# Patient Record
Sex: Female | Born: 1950 | Race: White | Hispanic: Yes | Marital: Married | Smoking: Never smoker
Health system: Southern US, Community
[De-identification: ages and names within clinical notes are randomized; demographics above are authoritative.]

## PROBLEM LIST (undated history)

## (undated) DIAGNOSIS — I10 Essential (primary) hypertension: Secondary | ICD-10-CM

## (undated) DIAGNOSIS — E119 Type 2 diabetes mellitus without complications: Secondary | ICD-10-CM

## (undated) HISTORY — PX: CATARACT EXTRACTION, BILATERAL: SHX1313

## (undated) HISTORY — DX: Essential (primary) hypertension: I10

## (undated) HISTORY — DX: Type 2 diabetes mellitus without complications: E11.9

---

## 2016-09-28 ENCOUNTER — Encounter: Payer: Self-pay | Admitting: Internal Medicine

## 2016-09-28 ENCOUNTER — Ambulatory Visit: Payer: Self-pay | Attending: Internal Medicine | Admitting: Internal Medicine

## 2016-09-28 VITALS — BP 135/80 | HR 70 | Temp 98.7°F | Resp 16 | Ht 61.0 in | Wt 129.0 lb

## 2016-09-28 DIAGNOSIS — I152 Hypertension secondary to endocrine disorders: Secondary | ICD-10-CM | POA: Insufficient documentation

## 2016-09-28 DIAGNOSIS — Z1159 Encounter for screening for other viral diseases: Secondary | ICD-10-CM | POA: Insufficient documentation

## 2016-09-28 DIAGNOSIS — N8110 Cystocele, unspecified: Secondary | ICD-10-CM | POA: Insufficient documentation

## 2016-09-28 DIAGNOSIS — I10 Essential (primary) hypertension: Secondary | ICD-10-CM | POA: Insufficient documentation

## 2016-09-28 DIAGNOSIS — E785 Hyperlipidemia, unspecified: Secondary | ICD-10-CM | POA: Insufficient documentation

## 2016-09-28 DIAGNOSIS — K029 Dental caries, unspecified: Secondary | ICD-10-CM | POA: Insufficient documentation

## 2016-09-28 DIAGNOSIS — E119 Type 2 diabetes mellitus without complications: Secondary | ICD-10-CM | POA: Insufficient documentation

## 2016-09-28 LAB — POCT GLYCOSYLATED HEMOGLOBIN (HGB A1C): Hemoglobin A1C: 5.7

## 2016-09-28 LAB — GLUCOSE, POCT (MANUAL RESULT ENTRY): POC GLUCOSE: 150 mg/dL — AB (ref 70–99)

## 2016-09-28 MED ORDER — LISINOPRIL-HYDROCHLOROTHIAZIDE 20-12.5 MG PO TABS: 1.0000 | ORAL_TABLET | Freq: Every day | ORAL | 6 refills | Status: DC

## 2016-09-28 MED ORDER — GLIPIZIDE-METFORMIN HCL 5-500 MG PO TABS: 1.0000 | ORAL_TABLET | Freq: Two times a day (BID) | ORAL | 6 refills | Status: DC

## 2016-09-28 MED ORDER — GLIPIZIDE 5 MG PO TABS: 5.0000 mg | ORAL_TABLET | Freq: Every day | ORAL | 6 refills | Status: DC

## 2016-09-28 MED ORDER — SIMVASTATIN 20 MG PO TABS: 20.0000 mg | ORAL_TABLET | Freq: Every day | ORAL | 6 refills | Status: DC

## 2016-09-28 MED ORDER — GLUCOSE BLOOD VI STRP: ORAL_STRIP | 6 refills | Status: DC

## 2016-09-28 NOTE — Patient Instructions (Addendum)
Please have patient sign a release for us to get her records from [place]. Give appointment with Ms. Boyd to sign up for [employer].  Gave appointment for her to return to the lab this week to have her blood test done.   I have sent your medications to our pharmacy for pick up.   Pelvic Organ Prolapse Pelvic organ prolapse is the stretching, bulging, or dropping of pelvic organs into an abnormal position. It happens when the muscles and tissues that surround and support pelvic structures are stretched or weak. Pelvic organ prolapse can involve:  Vagina (vaginal prolapse).  Uterus (uterine prolapse).  Bladder (cystocele).  Rectum (rectocele).  Intestines (enterocele). When organs other than the vagina are involved, they often bulge into the vagina or protrude from the vagina, depending on how severe the prolapse is. What are the causes? Causes of this condition include:  Pregnancy, labor, and childbirth.  Long-lasting (chronic) cough.  Chronic constipation.  Obesity.  Past pelvic surgery.  Aging. During and after menopause, a decreased production of the hormone estrogen can weaken pelvic ligaments and muscles.  Consistently lifting more than 50 lb (23 kg).  Buildup of fluid in the abdomen due to certain diseases and other conditions. What are the signs or symptoms? Symptoms of this condition include:  Loss of bladder control when you cough, sneeze, strain, and exercise (stress incontinence). This may be worse immediately following childbirth, and it may gradually improve over time.  Feeling pressure in your pelvis or vagina. This pressure may increase when you cough or when you are having a bowel movement.  A bulge that protrudes from the opening of your vagina or against your vaginal wall. If your uterus protrudes through the opening of your vagina and rubs against your clothing, you may also experience soreness, ulcers, infection, pain, and bleeding.  Increased  effort to have a bowel movement or urinate.  Pain in your low back.  Pain, discomfort, or disinterest in sexual intercourse.  Repeated bladder infections (urinary tract infections).  Difficulty inserting or inability to insert a tampon or applicator. In some people, this condition does not cause any symptoms. How is this diagnosed? Your health care provider may perform an internal and external vaginal and rectal exam. During the exam, you may be asked to cough and strain while you are lying down, sitting, and standing up. Your health care provider will determine if other tests are required, such as bladder function tests. How is this treated? In most cases, this condition needs to be treated only if it produces symptoms. No treatment is guaranteed to correct the prolapse or relieve the symptoms completely. Treatment may include:  Lifestyle changes, such as:  Avoiding drinking beverages that contain caffeine.  Increasing your intake of high-fiber foods. This can help to decrease constipation and straining during bowel movements.  Emptying your bladder at scheduled times (bladder training therapy). This can help to reduce or avoid urinary incontinence.  Losing weight if you are overweight or obese.  Estrogen. Estrogen may help mild prolapse by increasing the strength and tone of pelvic floor muscles.  Kegel exercises. These may help mild cases of prolapse by strengthening and tightening the muscles of the pelvic floor.  Pessary insertion. A pessary is a soft, flexible device that is placed into your vagina by your health care provider to help support the vaginal walls and keep pelvic organs in place.  Surgery. This is often the only form of treatment for severe prolapse. Different types of surgeries  are available. Follow these instructions at home:  Wear a sanitary pad or absorbent product if you have urinary incontinence.  Avoid heavy lifting and straining with exercise and work.  Do not hold your breath when you perform mild to moderate lifting and exercise activities. Limit your activities as directed by your health care provider.  Take medicines only as directed by your health care provider.  Perform Kegel exercises as directed by your health care provider.  If you have a pessary, take care of it as directed by your health care provider. Contact a health care provider if:  Your symptoms interfere with your daily activities or sex life.  You need medicine to help with the discomfort.  You notice bleeding from the vagina that is not related to your period.  You have a fever.  You have pain or bleeding when you urinate.  You have bleeding when you have a bowel movement.  You lose urine when you have sex.  You have chronic constipation.  You have a pessary that falls out.  You have vaginal discharge that has a bad smell.  You have low abdominal pain or cramping that is unusual for you. This information is not intended to replace advice given to you by your health care provider. Make sure you discuss any questions you have with your health care provider. Document Released: 11/22/2013 Document Revised: 10/03/2015 Document Reviewed: 07/10/2013 Elsevier Interactive Patient Education  2017 [employer].

## 2016-09-28 NOTE — Progress Notes (Signed)
Patient ID: Michele [name], female    DOB: 07/12/1950  MRN: [id number]  CC: No chief complaint on file.   Subjective: Michele Gourdydia Sweeny is a 66 y.o. female who presents for new pt.  Recently moved from [place] with [place].  Hx of DM type 2, HTN and hyperlipidemia.  Her concerns today include:  She is LPN but currently unemployed. Needs help with getting meds RF 1. DM: -checking BS 2-3 x a day. Range 130-160. Sometimes 200. -Eating habits: "I try to keep balance." Walking daily for 30 mins Eye: last exam 2 yrs ago -some cramps in legs at night. No numbness in feet  2.  HTN -compliant with med Limits salt -no CP/SOB/LE edema.  3.  Lipid: tolerating Zocor.  Out of med x 2 wks.    4.  Vaginal prolapse for 5 yrs.  + leakage of urine. Unable to afford to get fix in [place].  Request GYN appt   HM: last MMG and PAP less than 1 yr ago in [place].  Never had colonoscopy, had FOBT 2 yrs ago.  Had Tdap.  Not on File  Social History   Social History  . Marital status: Married    Spouse name: N/A  . Number of children: 2  . Years of education: N/A   Occupational History  . LPN    Social History Main Topics  . Smoking status: Never Smoker  . Smokeless tobacco: Never Used  . Alcohol use No  . Drug use: No  . Sexual activity: Yes   Other Topics Concern  . Not on file   Social History Narrative  . No narrative on file    Family History  Problem Relation Age of Onset  . Diabetes Mother   . Hypertension Mother   . Diabetes Father   . Hypertension Father   . Diabetes Brother   . Hypertension Brother     History reviewed. No pertinent surgical history.  ROS: Review of Systems  Constitutional: Negative for activity change, appetite change, fatigue, fever and unexpected weight change.  Eyes: Negative for visual disturbance.  Respiratory: Negative for cough and chest tightness.   Cardiovascular: Negative for chest pain, palpitations and leg swelling.    Gastrointestinal: Negative for abdominal pain and constipation.  Musculoskeletal: Negative for arthralgias.  Psychiatric/Behavioral: Negative for sleep disturbance.    PHYSICAL EXAM: BP 135/80 (BP Location: Left Arm, Patient Position: Sitting, Cuff Size: Normal)   Pulse 70   Temp 98.7 F (37.1 C) (Oral)   Resp 16   Ht 5\' 1"  (1.549 m)   Wt 129 lb (58.5 kg)   SpO2 97%   BMI 24.37 kg/m   Physical Exam  Constitutional: She appears well-developed and well-nourished.  HENT:  Nose: Nose normal.  Mouth/Throat: Oropharynx is clear and moist. No oropharyngeal exudate.  Cavity RT upper canine  Eyes: EOM are normal. No scleral icterus.  Neck: No thyromegaly present.  Cardiovascular: Normal rate and regular rhythm.  Exam reveals no gallop.   No murmur heard. Pulmonary/Chest: Effort normal and breath sounds normal.  Genitourinary:  Genitourinary Comments: CMA, Vonna KotykJay present: No external lesions. Cervix sitting at introtus and prolapses out with valsalva. Manually pushed back into vagina  Musculoskeletal: Normal range of motion.  Lymphadenopathy:    She has no cervical adenopathy.  Neurological: No cranial nerve deficit.  Skin: No rash noted.  Psychiatric: She has a normal mood and affect.   Results for orders placed or performed in visit on 09/28/16  Glucose (  CBG)  Result Value Ref Range   POC Glucose 150 (A) 70 - 99 mg/dl  HgB N5A  Result Value Ref Range   Hemoglobin A1C 5.7      Depression screen [facility] 2/9 09/28/2016  Decreased Interest 0  Down, Depressed, Hopeless 0  PHQ - 2 Score 0  Altered sleeping 0  Tired, decreased energy 0  Change in appetite 0  Feeling bad or failure about yourself  0  Trouble concentrating 0  Moving slowly or fidgety/restless 0  Suicidal thoughts 0  PHQ-9 Score 0    GAD 7 : Generalized Anxiety Score 09/28/2016  Nervous, Anxious, on Edge 0  Control/stop worrying 0  Worry too much - different things 0  Trouble relaxing 0  Restless 0  Easily  annoyed or irritable 0  Afraid - awful might happen 0  Total GAD 7 Score 0    ASSESSMENT AND PLAN: 1. Controlled type 2 diabetes mellitus without complication, without long-term current use of insulin (HCC) -encouraged to continue healthy eating habits and regular exercise -stop Glucotrol 5 mg if starts getting hypoglycemic episodes - glipiZIDE (GLUCOTROL) 5 MG tablet; Take 1 tablet (5 mg total) by mouth daily before breakfast.  Dispense: 30 tablet; Refill: 6 - glipiZIDE-metformin (METAGLIP) 5-500 MG tablet; Take 1 tablet by mouth 2 (two) times daily before a meal.  Dispense: 60 tablet; Refill: 6 - glucose blood test strip; Use as instructed  Dispense: 100 each; Refill: 6 - CBC; Future - Comprehensive metabolic panel; Future - Lipid panel; Future - Glucose (CBG) - HgB A1c  2. Essential hypertension DASH encouraged - lisinopril-hydrochlorothiazide (PRINZIDE,ZESTORETIC) 20-12.5 MG tablet; Take 1 tablet by mouth daily.  Dispense: 30 tablet; Refill: 6  3. Hyperlipidemia, unspecified hyperlipidemia type - simvastatin (ZOCOR) 20 MG tablet; Take 1 tablet (20 mg total) by mouth daily.  Dispense: 30 tablet; Refill: 6  4. Vaginal prolapse - Ambulatory referral to Gynecology  5. Dental caries - Ambulatory referral to Dentistry  6. Need for hepatitis C screening test - Hepatitis C Antibody; Future  HM: last MMG and PAP less than 1 yr ago in [place].  Never had colonoscopy, had FOBT 2 yrs ago.  Had Tdap. Declines HIV screen.  Pt to sign release for me to get her records from previous provider in [place]  Patient was given the opportunity to ask questions.  Patient verbalized understanding of the plan and was able to repeat key elements of the plan.   Orders Placed This Encounter  Procedures  . CBC  . Comprehensive metabolic panel  . Lipid panel  . Hepatitis C Antibody  . Ambulatory referral to Gynecology  . Ambulatory referral to Dentistry  . Glucose (CBG)  . HgB A1c      Requested Prescriptions   Signed Prescriptions Disp Refills  . glipiZIDE (GLUCOTROL) 5 MG tablet 30 tablet 6    Sig: Take 1 tablet (5 mg total) by mouth daily before breakfast.  . glipiZIDE-metformin (METAGLIP) 5-500 MG tablet 60 tablet 6    Sig: Take 1 tablet by mouth 2 (two) times daily before a meal.  . glucose blood test strip 100 each 6    Sig: Use as instructed  . lisinopril-hydrochlorothiazide (PRINZIDE,ZESTORETIC) 20-12.5 MG tablet 30 tablet 6    Sig: Take 1 tablet by mouth daily.  . simvastatin (ZOCOR) 20 MG tablet 30 tablet 6    Sig: Take 1 tablet (20 mg total) by mouth daily.    No Follow-up on file.  Jonah Blue, MD, FACP

## 2016-09-29 ENCOUNTER — Ambulatory Visit: Payer: Self-pay | Attending: Internal Medicine

## 2016-09-29 DIAGNOSIS — E119 Type 2 diabetes mellitus without complications: Secondary | ICD-10-CM | POA: Insufficient documentation

## 2016-09-29 DIAGNOSIS — Z1159 Encounter for screening for other viral diseases: Secondary | ICD-10-CM | POA: Diagnosis not present

## 2016-09-29 MED FILL — SIMVASTATIN 20 MG TABLET: 20 | 30 days supply | Qty: 30 | Fill #0

## 2016-09-29 MED FILL — glipiZIDE 5 MG TABS: 5 | 30 days supply | Qty: 30 | Fill #0

## 2016-09-29 MED FILL — TRUE METRIX TEST STRIP: 30 days supply | Qty: 100 | Fill #0

## 2016-09-29 MED FILL — LISINOPRIL-HCTZ 20-12.5 MG: 20-12.5 | 30 days supply | Qty: 30 | Fill #0

## 2016-09-29 NOTE — Progress Notes (Signed)
Patient here for lab visit only 

## 2016-09-30 ENCOUNTER — Ambulatory Visit: Payer: Self-pay | Attending: Internal Medicine

## 2016-09-30 ENCOUNTER — Other Ambulatory Visit: Payer: Self-pay | Admitting: *Deleted

## 2016-09-30 ENCOUNTER — Encounter: Payer: Self-pay | Admitting: Obstetrics & Gynecology

## 2016-09-30 LAB — COMPREHENSIVE METABOLIC PANEL
ALK PHOS: 69 IU/L (ref 39–117)
ALT: 15 IU/L (ref 0–32)
AST: 19 IU/L (ref 0–40)
Albumin/Globulin Ratio: 1.6 (ref 1.2–2.2)
Albumin: 4.3 g/dL (ref 3.6–4.8)
BILIRUBIN TOTAL: 0.3 mg/dL (ref 0.0–1.2)
BUN/Creatinine Ratio: 13 (ref 12–28)
BUN: 7 mg/dL — ABNORMAL LOW (ref 8–27)
CHLORIDE: 98 mmol/L (ref 96–106)
CO2: 26 mmol/L (ref 18–29)
CREATININE: 0.55 mg/dL — AB (ref 0.57–1.00)
Calcium: 10 mg/dL (ref 8.7–10.3)
GFR calc Af Amer: 115 mL/min/{1.73_m2} (ref >59)
GFR calc non Af Amer: 99 mL/min/{1.73_m2} (ref >59)
GLOBULIN, TOTAL: 2.7 g/dL (ref 1.5–4.5)
GLUCOSE: 110 mg/dL — AB (ref 65–99)
Potassium: 4 mmol/L (ref 3.5–5.2)
SODIUM: 140 mmol/L (ref 134–144)
Total Protein: 7 g/dL (ref 6.0–8.5)

## 2016-09-30 LAB — LIPID PANEL
Chol/HDL Ratio: 3.3 ratio (ref 0.0–4.4)
Cholesterol, Total: 230 mg/dL — ABNORMAL HIGH (ref 100–199)
HDL: 69 mg/dL (ref >39)
LDL Calculated: 122 mg/dL — ABNORMAL HIGH (ref 0–99)
Triglycerides: 194 mg/dL — ABNORMAL HIGH (ref 0–149)
VLDL Cholesterol Cal: 39 mg/dL (ref 5–40)

## 2016-09-30 LAB — CBC
HEMATOCRIT: 39.5 % (ref 34.0–46.6)
Hemoglobin: 13.3 g/dL (ref 11.1–15.9)
MCH: 27.9 pg (ref 26.6–33.0)
MCHC: 33.7 g/dL (ref 31.5–35.7)
MCV: 83 fL (ref 79–97)
Platelets: 333 10*3/uL (ref 150–379)
RBC: 4.77 x10E6/uL (ref 3.77–5.28)
RDW: 15 % (ref 12.3–15.4)
WBC: 6.6 10*3/uL (ref 3.4–10.8)

## 2016-09-30 LAB — HEPATITIS C ANTIBODY: Hep C Virus Ab: <0.1 s/co ratio (ref 0.0–0.9)

## 2016-09-30 MED ORDER — TRUEPLUS LANCETS 28G MISC: 1.0000 | Freq: Three times a day (TID) | 12 refills | Status: DC

## 2016-09-30 MED ORDER — TRUE METRIX METER W/DEVICE KIT: 1.0000 | PACK | Freq: Three times a day (TID) | 0 refills | Status: DC

## 2016-09-30 MED ORDER — METFORMIN HCL 500 MG PO TABS: 500.0000 mg | ORAL_TABLET | Freq: Two times a day (BID) | ORAL | 3 refills | Status: DC

## 2016-10-01 ENCOUNTER — Other Ambulatory Visit: Payer: Self-pay | Admitting: Internal Medicine

## 2016-10-01 ENCOUNTER — Encounter: Payer: Self-pay | Admitting: Internal Medicine

## 2016-10-01 DIAGNOSIS — E119 Type 2 diabetes mellitus without complications: Secondary | ICD-10-CM

## 2016-10-01 MED ORDER — GLIPIZIDE 5 MG PO TABS: ORAL_TABLET | ORAL | 6 refills | Status: DC

## 2016-10-01 MED ORDER — METFORMIN HCL 500 MG PO TABS: 500.0000 mg | ORAL_TABLET | Freq: Two times a day (BID) | ORAL | 3 refills | Status: DC

## 2016-10-01 MED FILL — ?GLIPIZIDE 5MG TABLET: 5 | 30 days supply | Qty: 90 | Fill #0

## 2016-10-01 MED FILL — TRUEplus LANCETS 28G MISC: 30 days supply | Qty: 100 | Fill #0

## 2016-10-01 MED FILL — ?METFORMIN HCL 500MG TABLET: 500 | 30 days supply | Qty: 60 | Fill #0

## 2016-10-01 MED FILL — !TRUE METRIX BLOOD GLUCOSE: 30 days supply | Qty: 1 | Fill #0

## 2016-10-01 NOTE — Progress Notes (Signed)
I received message from pharmacy request Metfor-Glipizide combo be changed to individual meds.  I called pt to confirm that she was taking Metfor-Glip 500/5 mg BID and Glipizide 5 mg once a day.  I told her that [facility] pharmacy does not have the Metformin/Glipizide combo so will change to Metformin 500 mg BID and Glipizide 5 mg in a.m and 10 mg p.m with meals.  Pt expressed understanding.  Rx sent to pharmacy.

## 2016-10-01 NOTE — Progress Notes (Signed)
[facility] Metformin-Glipizide combo be changed to individual meds.  I called pt to confirm that she was on Metformin-Glipizide 500/5 mg BID and an additional 5 mg Glipizide in mornings.  She confirmed this.   So will send rxn to pharmacy for Glipizide 5 mg in a.m and 10 mg in p.m with meals and Metformin 500 mg BID.  I advised pt of same.

## 2016-10-02 ENCOUNTER — Telehealth: Payer: Self-pay

## 2016-10-02 NOTE — Telephone Encounter (Signed)
-----   Message from Marcine Matareborah B Johnson, MD sent at 09/30/2016  3:08 PM EDT ----- Let pt know that blood count, kidney and liver function tests are normal.  LDL cholesterol was elevated at 122 with goal being less than 70.  This may be due to the fact that she was out of her cholesterol medications for several weeks prior to recent visit.  Continue to take cholesterol lowering medication.  Hepatitis C screen is negative.

## 2016-10-02 NOTE — Telephone Encounter (Signed)
Clld pt -   [employer] Lafonda MossesDiana ID# [id number]  Advsd of lab results; provider's notation - Pt stated she understood and had no questions.

## 2016-10-27 ENCOUNTER — Ambulatory Visit (INDEPENDENT_AMBULATORY_CARE_PROVIDER_SITE_OTHER): Payer: Self-pay | Admitting: Obstetrics & Gynecology

## 2016-10-27 ENCOUNTER — Ambulatory Visit: Payer: Self-pay | Attending: Internal Medicine

## 2016-10-27 ENCOUNTER — Encounter: Payer: Self-pay | Admitting: Obstetrics & Gynecology

## 2016-10-27 VITALS — BP 146/78 | HR 84 | Wt 127.9 lb

## 2016-10-27 DIAGNOSIS — N813 Complete uterovaginal prolapse: Secondary | ICD-10-CM

## 2016-10-27 MED FILL — SIMVASTATIN 20 MG TABLET: 20 | 30 days supply | Qty: 30 | Fill #1

## 2016-10-27 MED FILL — LISINOPRIL-HCTZ 20-12.5 MG: 20-12.5 | 30 days supply | Qty: 30 | Fill #1

## 2016-10-27 MED FILL — ?METFORMIN HCL 500MG TABLET: 500 | 30 days supply | Qty: 60 | Fill #1

## 2016-10-27 NOTE — Progress Notes (Signed)
   Subjective:    Patient ID: Michele [name], female    DOB: 03/10/1951, 66 y.o.   MRN: [id number]  HPI 66 yo MH P4 here with a 6+ year h/o uterine prolapse. She has not had sex for about 2 years due to this issue. She says that her husband is ok with this. She categorically denies urinary incontinence "unless I've held it too long". She says that in the last month she has not had any urinary accidents.  She has used a Gelhorn pessary in the past.   Review of Systems She has DM. She reports a normal pap about a year ago.    Objective:   Physical Exam [id number] Breathing, conversing, and ambulating normally Her small uterus is at the vaginal introitus.  Moderate amount of vulvovaginal atrophy No masses with bimanual exam  I tried several pessaries. The #3 with support worked but she said that is was a little uncomfortable.     Assessment & Plan:  Uterine prolapse- I offered her a TVH/BSO/a&p repair, but also told her that it is likely that she may then have vaginal prolapse in the futures. I offered her a referral to a specialist, Dr. Lavella Hammockatherine Matthews. I offered to order a #2 pessary. She opts for the pessary She will return when it has arrived.

## 2016-11-16 MED FILL — ?METFORMIN HCL 500MG TABLET: 500 | 30 days supply | Qty: 60 | Fill #2

## 2016-11-16 MED FILL — LISINOPRIL-HCTZ 20-12.5 MG: 20-12.5 | 30 days supply | Qty: 30 | Fill #2

## 2016-11-19 MED FILL — TRUE METRIX TEST STRIP: 30 days supply | Qty: 100 | Fill #1

## 2016-11-25 ENCOUNTER — Encounter: Payer: Self-pay | Admitting: Obstetrics & Gynecology

## 2016-11-25 ENCOUNTER — Ambulatory Visit (INDEPENDENT_AMBULATORY_CARE_PROVIDER_SITE_OTHER): Payer: Self-pay | Admitting: Obstetrics & Gynecology

## 2016-11-25 VITALS — BP 145/77 | HR 79 | Wt 125.7 lb

## 2016-11-25 DIAGNOSIS — N813 Complete uterovaginal prolapse: Secondary | ICD-10-CM

## 2016-11-25 NOTE — Progress Notes (Signed)
   Subjective:    Patient ID: Michele [name], female    DOB: 10/03/1950, 66 y.o.   MRN: [id number]  HPI  66 yo lady here for her #2pessary with support. This was ordered several weeks ago.   Review of Systems     Objective:   Physical Exam [id number] Breathing, conversing, and ambulating normally I placed the pessary. It relieved her symptoms and did not cause pain. She was able to remove and replace it.       Assessment & Plan:  Uterine prolapse- pessary Rec remove it at least 1 night per week RTC 4 weeks/prn sooner

## 2016-12-07 ENCOUNTER — Ambulatory Visit: Payer: Self-pay | Attending: Internal Medicine | Admitting: Internal Medicine

## 2016-12-07 ENCOUNTER — Encounter: Payer: Self-pay | Admitting: Internal Medicine

## 2016-12-07 VITALS — BP 140/84 | HR 81 | Temp 98.1°F | Resp 16 | Wt 127.8 lb

## 2016-12-07 DIAGNOSIS — N814 Uterovaginal prolapse, unspecified: Secondary | ICD-10-CM | POA: Insufficient documentation

## 2016-12-07 DIAGNOSIS — E119 Type 2 diabetes mellitus without complications: Secondary | ICD-10-CM

## 2016-12-07 DIAGNOSIS — N8110 Cystocele, unspecified: Secondary | ICD-10-CM

## 2016-12-07 DIAGNOSIS — Z833 Family history of diabetes mellitus: Secondary | ICD-10-CM | POA: Insufficient documentation

## 2016-12-07 DIAGNOSIS — I10 Essential (primary) hypertension: Secondary | ICD-10-CM

## 2016-12-07 DIAGNOSIS — Z1211 Encounter for screening for malignant neoplasm of colon: Secondary | ICD-10-CM

## 2016-12-07 DIAGNOSIS — E1165 Type 2 diabetes mellitus with hyperglycemia: Secondary | ICD-10-CM

## 2016-12-07 DIAGNOSIS — IMO0001 Reserved for inherently not codable concepts without codable children: Secondary | ICD-10-CM

## 2016-12-07 DIAGNOSIS — Z79899 Other long term (current) drug therapy: Secondary | ICD-10-CM | POA: Insufficient documentation

## 2016-12-07 DIAGNOSIS — E785 Hyperlipidemia, unspecified: Secondary | ICD-10-CM | POA: Insufficient documentation

## 2016-12-07 DIAGNOSIS — Z8249 Family history of ischemic heart disease and other diseases of the circulatory system: Secondary | ICD-10-CM | POA: Insufficient documentation

## 2016-12-07 LAB — POCT URINALYSIS DIPSTICK
BILIRUBIN UA: NEGATIVE
KETONES UA: NEGATIVE
Nitrite, UA: NEGATIVE
Protein, UA: NEGATIVE
Urobilinogen, UA: 0.2 E.U./dL
pH, UA: 5 (ref 5.0–8.0)

## 2016-12-07 LAB — GLUCOSE, POCT (MANUAL RESULT ENTRY): POC Glucose: 359 mg/dl — AB (ref 70–99)

## 2016-12-07 MED ORDER — METFORMIN HCL 500 MG PO TABS: 500.0000 mg | ORAL_TABLET | Freq: Two times a day (BID) | ORAL | 6 refills | Status: DC

## 2016-12-07 MED ORDER — GLIPIZIDE 5 MG PO TABS: 5.0000 mg | ORAL_TABLET | Freq: Two times a day (BID) | ORAL | 6 refills | Status: DC

## 2016-12-07 MED FILL — ?METFORMIN HCL 500MG TABLET: 500 | 30 days supply | Qty: 60 | Fill #3

## 2016-12-07 MED FILL — SIMVASTATIN 20 MG TABLET: 20 | 30 days supply | Qty: 30 | Fill #2

## 2016-12-07 MED FILL — glipiZIDE 5 MG TABS: 5 | 30 days supply | Qty: 90 | Fill #1

## 2016-12-07 NOTE — Progress Notes (Signed)
Patient ID: Michele [name], female    DOB: 29-Jul-1950  MRN: [id number]  CC: Follow-up   Subjective: Michele [name] is a 66 y.o. female who presents for 2 mth f/u. Her concerns today include:  Patient with history of diabetes type 2, HTN, hyperlipidemia, vaginal prolapse  1. DM:  Checks BS 2-3 x a day but does not have log with her. Before breakfast -105-120, afternoon 150-160 and bedtime 200-250.  Suppose to be on metformin 500 mg twice a day and Glucotrol 5 mg a.m./10 mg p.m. however on questioning she is taking metformin only in the evenings and glipizide 5 mg twice a day. Does not take a.m. dose of metformin because she is afraid of blood sugars dropping too low. But again on further questioning she then said she is not taking it twice a day because the pharmacy is not giving her 30 day supply and she runs out before 1 month. -I called the pharmacy to verify. They have been giving her one-month supply on metformin. She last picked up glipizide in May -Patient then admitted that she was on vacation in [place] and misplaced her bottle of metformin.  -no increase thrist or frequent urination -due for eye exam -tries to avoid too much carb and sugar.  Walking daily.   2. HTN Compliant with lisinopril/HCTZ and DASH.Marland Kitchen No CP/SOB  3. Vaginal Prolapse -given pessary by GYN. Working OK  4. Due for colon CA screening. Does have [place] card.  -We requested her records from [place]. To date I have not received them.  Patient Active Problem List   Diagnosis Date Noted  . Complete uterine prolapse 10/27/2016  . Controlled type 2 diabetes mellitus without complication, without long-term current use of insulin ([place]) 09/28/2016  . Essential hypertension 09/28/2016  . Hyperlipidemia 09/28/2016  . Vaginal prolapse 09/28/2016     Current Outpatient Prescriptions on File Prior to Visit  Medication Sig Dispense Refill  . Blood Glucose Monitoring Suppl (TRUE METRIX METER) w/Device KIT 1  each by Does not apply route 3 (three) times daily. 1 kit 0  . glucose blood test strip Use as instructed 100 each 6  . lisinopril-hydrochlorothiazide (PRINZIDE,ZESTORETIC) 20-12.5 MG tablet Take 1 tablet by mouth daily. 30 tablet 6  . simvastatin (ZOCOR) 20 MG tablet Take 1 tablet (20 mg total) by mouth daily. 30 tablet 6  . TRUEPLUS LANCETS 28G MISC 1 each by Does not apply route 3 (three) times daily. 100 each 12   No current facility-administered medications on file prior to visit.     No Known Allergies  Social History   Social History  . Marital status: Married    Spouse name: N/A  . Number of children: 2  . Years of education: N/A   Occupational History  . LPN    Social History Main Topics  . Smoking status: Never Smoker  . Smokeless tobacco: Never Used  . Alcohol use No  . Drug use: No  . Sexual activity: Yes   Other Topics Concern  . Not on file   Social History Narrative  . No narrative on file    Family History  Problem Relation Age of Onset  . Diabetes Mother   . Hypertension Mother   . Diabetes Father   . Hypertension Father   . Diabetes Brother   . Hypertension Brother     No past surgical history on file.  ROS: Review of Systems Negative except as stated above. PHYSICAL EXAM: BP (!) 143/84  Pulse 81   Temp 98.1 F (36.7 C) (Oral)   Resp 16   Wt 127 lb 12.8 oz (58 kg)   SpO2 95%   BMI 24.15 kg/m   BP 140/84 Wt Readings from Last 3 Encounters:  12/07/16 127 lb 12.8 oz (58 kg)  11/25/16 125 lb 11.2 oz (57 kg)  10/27/16 127 lb 14.4 oz (58 kg)   Physical Exam  General appearance - alert, well appearing, elderly FM and in no distress Mental status - alert, oriented to person, place, and time Neck - supple, no significant adenopathy Chest - clear to auscultation, no wheezes, rales or rhonchi, symmetric air entry Heart - normal rate, regular rhythm, normal S1, S2, no murmurs, rubs, clicks or gallops Extremities - peripheral pulses  normal, no pedal edema, no clubbing or cyanosis Diabetic Foot Exam - Simple   Simple Foot Form Visual Inspection No deformities, no ulcerations, no other skin breakdown bilaterally:  Yes Sensation Testing Intact to touch and monofilament testing bilaterally:  Yes Pulse Check Comments      Results for orders placed or performed in visit on 12/07/16  POCT glucose (manual entry)  Result Value Ref Range   POC Glucose 359 (A) 70 - 99 mg/dl  POCT urinalysis dipstick  Result Value Ref Range   Color, UA clear    Clarity, UA yellow    Glucose, UA >=1,000    Bilirubin, UA negative    Ketones, UA negative    Spec Grav, UA <=1.005 (A) 1.010 - 1.025   Blood, UA trace    pH, UA 5.0 5.0 - 8.0   Protein, UA negative    Urobilinogen, UA 0.2 0.2 or 1.0 E.U./dL   Nitrite, UA negative    Leukocytes, UA Trace (A) Negative     ASSESSMENT AND PLAN: 1. Uncontrolled type 2 diabetes mellitus without complication, without long-term current use of insulin (HCC) -Encourage med compliance. Glipizide changed to 5 mg twice a day -Check blood sugars 2-3 times a day. She will bring in blood sugar readings on follow-up visit in 3 weeks with clinical pharmacists -Advised to have [facility] exam at [facility] or [facility] - POCT glucose (manual entry) - POCT urinalysis dipstick - metFORMIN (GLUCOPHAGE) 500 MG tablet; Take 1 tablet (500 mg total) by mouth 2 (two) times daily with a meal.  Dispense: 60 tablet; Refill: 6 - glipiZIDE (GLUCOTROL) 5 MG tablet; Take 1 tablet (5 mg total) by mouth 2 (two) times daily before a meal. 1 tab PO Q a.m with breakfast and 2 tabs with dinner  Dispense: 60 tablet; Refill: 6  2. Essential hypertension At goal Cont current med  3. Colon cancer screening - Ambulatory referral to Gastroenterology  4. Vaginal prolapse -doing well with trail of pessary  Await old records to confirm that she had mammogram and Pap less than 1 year ago per her report  Patient was given the opportunity  to ask questions.  Patient verbalized understanding of the plan and was able to repeat key elements of the plan.   Orders Placed This Encounter  Procedures  . Ambulatory referral to Gastroenterology  . POCT glucose (manual entry)  . POCT urinalysis dipstick     Requested Prescriptions   Signed Prescriptions Disp Refills  . metFORMIN (GLUCOPHAGE) 500 MG tablet 60 tablet 6    Sig: Take 1 tablet (500 mg total) by mouth 2 (two) times daily with a meal.  . glipiZIDE (GLUCOTROL) 5 MG tablet 60 tablet 6    Sig: Take  1 tablet (5 mg total) by mouth 2 (two) times daily before a meal. 1 tab PO Q a.m with breakfast and 2 tabs with dinner    Return in about 2 months (around 02/07/2017).  Karle Plumber, MD, FACP

## 2016-12-07 NOTE — Patient Instructions (Signed)
Give appt with Michele [name] in 3 weeks.   Take Metformin and glipizide twice a day as discussed.  Bring your blood sugar readings with you in 3 weeks when you come to see the clinical pharmacist.  [facility]   You have been referred for colonoscopy.

## 2016-12-21 MED FILL — glipiZIDE 5 MG TABS: 5 | 30 days supply | Qty: 90 | Fill #2

## 2016-12-21 MED FILL — LISINOPRIL-HCTZ 20-12.5 MG: 20-12.5 | 30 days supply | Qty: 30 | Fill #3

## 2016-12-21 MED FILL — TRUE METRIX TEST STRIP: 30 days supply | Qty: 100 | Fill #2

## 2016-12-24 ENCOUNTER — Ambulatory Visit (HOSPITAL_COMMUNITY)
Admission: EM | Admit: 2016-12-24 | Discharge: 2016-12-24 | Disposition: A | Discharge status: Home / Self Care | Payer: Self-pay | Attending: Family Medicine | Admitting: Family Medicine

## 2016-12-24 ENCOUNTER — Encounter (HOSPITAL_COMMUNITY): Payer: Self-pay | Admitting: Family Medicine

## 2016-12-24 DIAGNOSIS — R824 Acetonuria: Secondary | ICD-10-CM

## 2016-12-24 DIAGNOSIS — R81 Glycosuria: Secondary | ICD-10-CM

## 2016-12-24 DIAGNOSIS — N3090 Cystitis, unspecified without hematuria: Secondary | ICD-10-CM

## 2016-12-24 DIAGNOSIS — N814 Uterovaginal prolapse, unspecified: Secondary | ICD-10-CM

## 2016-12-24 DIAGNOSIS — R300 Dysuria: Secondary | ICD-10-CM

## 2016-12-24 LAB — POCT URINALYSIS DIP (DEVICE)
Glucose, UA: 100 mg/dL — AB
NITRITE: NEGATIVE
PH: 5.5 (ref 5.0–8.0)
UROBILINOGEN UA: 1 mg/dL (ref 0.0–1.0)

## 2016-12-24 MED ORDER — CEPHALEXIN 500 MG PO CAPS: 500.0000 mg | ORAL_CAPSULE | Freq: Four times a day (QID) | ORAL | 0 refills | Status: DC

## 2016-12-24 MED FILL — CEPHALEXIN 500 MG CAPSULE: 500 | 7 days supply | Qty: 28 | Fill #0

## 2016-12-24 NOTE — ED Triage Notes (Signed)
Pt here for mild RLQ pain and dysuria. sts also that she has a pessary and took it out today because she saw bleeding.

## 2016-12-24 NOTE — ED Provider Notes (Signed)
[facility]    CSN: [id number] Arrival date & time: 12/24/16  1430     History   Chief Complaint Chief Complaint  Patient presents with  . Dysuria    HPI Michele [name] is a 66 y.o. female.   66 year old female presents to the urgent care states she is had some vaginal bleeding, bleeding with urination, dysuria and right lower quadrant pain this started today after 11:30. She has a history of her "vagina dropping" however I suspect she means that her bladder was dropped. She had a pessary but recently removed it.      Past Medical History:  Diagnosis Date  . Diabetes mellitus without complication ([place])   . Hypertension     Patient Active Problem List   Diagnosis Date Noted  . Complete uterine prolapse 10/27/2016  . Controlled type 2 diabetes mellitus without complication, without long-term current use of insulin ([place]) 09/28/2016  . Essential hypertension 09/28/2016  . Hyperlipidemia 09/28/2016  . Vaginal prolapse 09/28/2016    History reviewed. No pertinent surgical history.  OB History    Gravida Para Term Preterm AB Living   '4 4 4         '$ SAB TAB Ectopic Multiple Live Births           4       Home Medications    Prior to Admission medications   Medication Sig Start Date End Date Taking? Authorizing Provider  Blood Glucose Monitoring Suppl (TRUE METRIX METER) w/Device KIT 1 each by Does not apply route 3 (three) times daily. 09/30/16   Ladell Pier, MD  cephALEXin (KEFLEX) 500 MG capsule Take 1 capsule (500 mg total) by mouth 4 (four) times daily. 12/24/16   Janne Napoleon, NP  glipiZIDE (GLUCOTROL) 5 MG tablet Take 1 tablet (5 mg total) by mouth 2 (two) times daily before a meal. 1 tab PO Q a.m with breakfast and 2 tabs with dinner 12/07/16   Ladell Pier, MD  glucose blood test strip Use as instructed 09/28/16   Ladell Pier, MD  lisinopril-hydrochlorothiazide (PRINZIDE,ZESTORETIC) 20-12.5 MG tablet Take 1 tablet by mouth daily.  09/28/16   Ladell Pier, MD  metFORMIN (GLUCOPHAGE) 500 MG tablet Take 1 tablet (500 mg total) by mouth 2 (two) times daily with a meal. 12/07/16   Ladell Pier, MD  simvastatin (ZOCOR) 20 MG tablet Take 1 tablet (20 mg total) by mouth daily. 09/28/16   Ladell Pier, MD  TRUEPLUS LANCETS 28G MISC 1 each by Does not apply route 3 (three) times daily. 09/30/16   Ladell Pier, MD    Family History Family History  Problem Relation Age of Onset  . Diabetes Mother   . Hypertension Mother   . Diabetes Father   . Hypertension Father   . Diabetes Brother   . Hypertension Brother     Social History Social History  Substance Use Topics  . Smoking status: Never Smoker  . Smokeless tobacco: Never Used  . Alcohol use No     Allergies   Patient has no known allergies.   Review of Systems Review of Systems  Constitutional: Negative.   Genitourinary: Positive for dysuria. Negative for menstrual problem and vaginal discharge.       As per history of present illness  All other systems reviewed and are negative.    Physical Exam Triage Vital Signs ED Triage Vitals [12/24/16 1459]  Enc Vitals Group     BP (!) 149/87  Pulse Rate 88     Resp 18     Temp 98.5 F (36.9 C)     Temp src      SpO2 97 %     Weight      Height      Head Circumference      Peak Flow      Pain Score 2     Pain Loc      Pain Edu?      Excl. in [place]?    No data found.   Updated Vital Signs BP (!) 149/87   Pulse 88   Temp 98.5 F (36.9 C)   Resp 18   SpO2 97%   Visual Acuity Right Eye Distance:   Left Eye Distance:   Bilateral Distance:    Right Eye Near:   Left Eye Near:    Bilateral Near:     Physical Exam  Constitutional: She appears well-developed and well-nourished. No distress.  Eyes: EOM are normal.  Neck: Neck supple.  Cardiovascular: Normal rate.   Pulmonary/Chest: Effort normal.  Genitourinary: Vagina normal. No vaginal discharge found.  Genitourinary  Comments: Cervix  in proper position however when the speculum is removed the cervix is brought out with the speculum to the introitus. The cervix is pain, no evidence of bleeding no discharge no other abnormalities. The meatus with minor erythema. Remainder of the anatomy is intact.  Loura Pardon, RN present during the exam  Musculoskeletal: She exhibits no edema.  Neurological: She is alert.  Skin: Skin is warm and dry.  Nursing note and vitals reviewed.    UC Treatments / Results  Labs (all labs ordered are listed, but only abnormal results are displayed) Labs Reviewed  POCT URINALYSIS DIP (DEVICE) - Abnormal; Notable for the following:       Result Value   Glucose, UA 100 (*)    Bilirubin Urine MODERATE (*)    Ketones, ur TRACE (*)    Hgb urine dipstick LARGE (*)    Protein, ur >=300 (*)    Leukocytes, UA TRACE (*)    All other components within normal limits    EKG  EKG Interpretation None       Radiology No results found.  Procedures Procedures (including critical care time)  Medications Ordered in UC Medications - No data to display   Initial Impression / Assessment and Plan / UC Course  I have reviewed the triage vital signs and the nursing notes.  Pertinent labs & imaging results that were available during my care of the patient were reviewed by me and considered in my medical decision making (see chart for details).     Recommend replacing the pessary. You also have a urinary tract infection. There is blood in your urine. There is also ketones and blood sugar. He will be treated with antibiotics. If you are not getting better in 3 days he will need to be reevaluated. A culture of your urine will be performed to make sure the antibiotic will work against the bacteria. Drink plenty of fluids. Take Azo-Standard also known as Pyridium or phenazopyridine up to 3 times a day as needed for urinary discomfort. This will turn her urine reddish orange.   Final  Clinical Impressions(s) / UC Diagnoses   Final diagnoses:  Cystitis  Dysuria  Prolapsed uterus  Ketonuria  Glycosuria    New Prescriptions New Prescriptions   CEPHALEXIN (KEFLEX) 500 MG CAPSULE    Take 1 capsule (500 mg total) by  mouth 4 (four) times daily.     Controlled Substance Prescriptions [place] Controlled Substance Registry consulted? Not Applicable   Janne Napoleon, NP 12/24/16 [phone]

## 2016-12-24 NOTE — Discharge Instructions (Signed)
Recommend replacing the pessary. You also have a urinary tract infection. There is blood in your urine. There is also ketones and blood sugar. He will be treated with antibiotics. If you are not getting better in 3 days he will need to be reevaluated. A culture of your urine will be performed to make sure the antibiotic will work against the bacteria. Drink plenty of fluids. Take Azo-Standard also known as Pyridium or phenazopyridine up to 3 times a day as needed for urinary discomfort. This will turn her urine reddish orange.

## 2017-01-05 MED FILL — SIMVASTATIN 20 MG TABLET: 20 | 30 days supply | Qty: 30 | Fill #3

## 2017-01-08 ENCOUNTER — Telehealth: Payer: Self-pay | Admitting: Internal Medicine

## 2017-01-08 ENCOUNTER — Encounter: Payer: Self-pay | Admitting: Obstetrics & Gynecology

## 2017-01-08 ENCOUNTER — Ambulatory Visit (INDEPENDENT_AMBULATORY_CARE_PROVIDER_SITE_OTHER): Payer: Self-pay | Admitting: Obstetrics & Gynecology

## 2017-01-08 VITALS — BP 150/78 | HR 75 | Wt 128.8 lb

## 2017-01-08 DIAGNOSIS — N813 Complete uterovaginal prolapse: Secondary | ICD-10-CM

## 2017-01-08 MED ORDER — ESTRADIOL 0.1 MG/GM VA CREA: TOPICAL_CREAM | VAGINAL | 12 refills | Status: DC

## 2017-01-08 NOTE — Telephone Encounter (Signed)
Michele [name] is she wanting the flu shot? If so pt can schedule a nurse visit for it.

## 2017-01-08 NOTE — Telephone Encounter (Signed)
Sorry sent to [facility] by accident

## 2017-01-08 NOTE — Telephone Encounter (Signed)
Pt came into office requesting to speak to nurse, would like to know record of immunization ( flu shot)...please f/up

## 2017-01-08 NOTE — Progress Notes (Signed)
   Subjective:    Patient ID: Michele [name], female    DOB: 01/02/1951, 66 y.o.   MRN: [id number]  HPI  66 yo MH P4 here with a 6+ year h/o uterine prolapse. She is using her pessary, removing it weekly. She is happy with it. She complains of vulvar itching.  Review of Systems     Objective:   Physical Exam  Well nourished, well hydrated Hispanic female, no apparent distress Breathing, conversing, and ambulating normally Cervix at introitus Atrophy noted No excoriattions       Assessment & Plan:  Prolapse- doing well with her pessary RTC 1 year/prn sooner Trial of vaginal estrogen 1 gram PV 3 nights per week

## 2017-01-12 MED FILL — !ESTRACE .01% CR: 0.1 | 30 days supply | Qty: 1 | Fill #0

## 2017-01-20 MED FILL — TRUE METRIX TEST STRIP: 30 days supply | Qty: 100 | Fill #3 | Status: TO

## 2017-01-20 MED FILL — glipiZIDE 5 MG TABS: 5 | 30 days supply | Qty: 90 | Fill #3

## 2017-01-20 MED FILL — ?METFORMIN HCL 500MG TABLET: 500 | 30 days supply | Qty: 60 | Fill #4

## 2017-01-20 MED FILL — LISINOPRIL-HCTZ 20-12.5 MG: 20-12.5 | 30 days supply | Qty: 30 | Fill #4

## 2017-02-03 MED FILL — SIMVASTATIN 20 MG TABLET: 20 | 30 days supply | Qty: 30 | Fill #4 | Status: TO

## 2017-02-23 MED FILL — ?METFORMIN HCL 500MG TABLET: 500 | 30 days supply | Qty: 60 | Fill #5 | Status: TO

## 2017-02-23 MED FILL — LISINOPRIL-HCTZ 20-12.5 MG: 20-12.5 | 30 days supply | Qty: 30 | Fill #5 | Status: TO

## 2017-02-23 MED FILL — ?GLIPIZIDE 5MG TABLET: 5 | 30 days supply | Qty: 90 | Fill #4 | Status: TO

## 2017-04-06 ENCOUNTER — Other Ambulatory Visit: Payer: Self-pay | Admitting: Internal Medicine

## 2017-04-06 DIAGNOSIS — E785 Hyperlipidemia, unspecified: Secondary | ICD-10-CM

## 2017-04-20 ENCOUNTER — Other Ambulatory Visit: Payer: Self-pay | Admitting: Internal Medicine

## 2017-04-20 DIAGNOSIS — I10 Essential (primary) hypertension: Secondary | ICD-10-CM

## 2017-04-21 ENCOUNTER — Other Ambulatory Visit: Payer: Self-pay | Admitting: Internal Medicine

## 2017-04-21 DIAGNOSIS — I10 Essential (primary) hypertension: Secondary | ICD-10-CM

## 2017-06-01 ENCOUNTER — Other Ambulatory Visit: Payer: Self-pay | Admitting: Internal Medicine

## 2017-06-01 DIAGNOSIS — I10 Essential (primary) hypertension: Secondary | ICD-10-CM

## 2017-06-03 ENCOUNTER — Telehealth: Payer: Self-pay | Admitting: Internal Medicine

## 2017-06-03 DIAGNOSIS — IMO0001 Reserved for inherently not codable concepts without codable children: Secondary | ICD-10-CM

## 2017-06-03 DIAGNOSIS — I10 Essential (primary) hypertension: Secondary | ICD-10-CM

## 2017-06-03 DIAGNOSIS — E119 Type 2 diabetes mellitus without complications: Secondary | ICD-10-CM

## 2017-06-03 DIAGNOSIS — E1165 Type 2 diabetes mellitus with hyperglycemia: Secondary | ICD-10-CM

## 2017-06-03 MED ORDER — LISINOPRIL-HYDROCHLOROTHIAZIDE 20-12.5 MG PO TABS: ORAL_TABLET | ORAL | 0 refills | Status: DC

## 2017-06-03 MED ORDER — GLIPIZIDE 5 MG PO TABS: 5.0000 mg | ORAL_TABLET | Freq: Two times a day (BID) | ORAL | 0 refills | Status: DC

## 2017-06-03 MED ORDER — METFORMIN HCL 500 MG PO TABS: 500.0000 mg | ORAL_TABLET | Freq: Two times a day (BID) | ORAL | 0 refills | Status: DC

## 2017-06-03 NOTE — Telephone Encounter (Signed)
lisinopril-hydrochlorothiazide (PRINZIDE,ZESTORETIC) 20-12.5 MG tablet [[id number]][[id number]]  metFORMIN (GLUCOPHAGE) 500 MG tablet [[id number]][[id number]]  glipiZIDE (GLUCOTROL) 5 MG tablet [[id number]][[id number]]

## 2017-06-03 NOTE — Telephone Encounter (Signed)
Refilled to last until next appt

## 2017-06-11 ENCOUNTER — Other Ambulatory Visit: Payer: Self-pay | Admitting: Internal Medicine

## 2017-06-11 DIAGNOSIS — E785 Hyperlipidemia, unspecified: Secondary | ICD-10-CM

## 2017-06-14 ENCOUNTER — Ambulatory Visit: Payer: Self-pay | Admitting: Internal Medicine

## 2017-06-15 ENCOUNTER — Other Ambulatory Visit: Payer: Self-pay | Admitting: Internal Medicine

## 2017-06-15 DIAGNOSIS — E1165 Type 2 diabetes mellitus with hyperglycemia: Principal | ICD-10-CM

## 2017-06-15 DIAGNOSIS — IMO0001 Reserved for inherently not codable concepts without codable children: Secondary | ICD-10-CM

## 2017-06-21 DIAGNOSIS — H2513 Age-related nuclear cataract, bilateral: Secondary | ICD-10-CM | POA: Diagnosis not present

## 2017-06-21 DIAGNOSIS — H5203 Hypermetropia, bilateral: Secondary | ICD-10-CM | POA: Diagnosis not present

## 2017-06-21 DIAGNOSIS — H25013 Cortical age-related cataract, bilateral: Secondary | ICD-10-CM | POA: Diagnosis not present

## 2017-06-21 DIAGNOSIS — Z7984 Long term (current) use of oral hypoglycemic drugs: Secondary | ICD-10-CM | POA: Diagnosis not present

## 2017-06-21 DIAGNOSIS — E119 Type 2 diabetes mellitus without complications: Secondary | ICD-10-CM | POA: Diagnosis not present

## 2017-06-21 DIAGNOSIS — H524 Presbyopia: Secondary | ICD-10-CM | POA: Diagnosis not present

## 2017-06-28 ENCOUNTER — Ambulatory Visit: Payer: Medicaid Other | Admitting: Internal Medicine

## 2017-06-29 ENCOUNTER — Ambulatory Visit: Payer: Medicare Other | Attending: Internal Medicine | Admitting: Internal Medicine

## 2017-06-29 ENCOUNTER — Encounter: Payer: Self-pay | Admitting: Internal Medicine

## 2017-06-29 VITALS — BP 129/88 | HR 79 | Temp 98.2°F | Resp 16 | Ht 63.0 in | Wt 131.2 lb

## 2017-06-29 DIAGNOSIS — E1165 Type 2 diabetes mellitus with hyperglycemia: Secondary | ICD-10-CM | POA: Diagnosis not present

## 2017-06-29 DIAGNOSIS — Z1211 Encounter for screening for malignant neoplasm of colon: Secondary | ICD-10-CM

## 2017-06-29 DIAGNOSIS — Z7984 Long term (current) use of oral hypoglycemic drugs: Secondary | ICD-10-CM | POA: Diagnosis not present

## 2017-06-29 DIAGNOSIS — E119 Type 2 diabetes mellitus without complications: Secondary | ICD-10-CM | POA: Insufficient documentation

## 2017-06-29 DIAGNOSIS — N8110 Cystocele, unspecified: Secondary | ICD-10-CM | POA: Diagnosis not present

## 2017-06-29 DIAGNOSIS — E785 Hyperlipidemia, unspecified: Secondary | ICD-10-CM

## 2017-06-29 DIAGNOSIS — Z2821 Immunization not carried out because of patient refusal: Secondary | ICD-10-CM | POA: Diagnosis not present

## 2017-06-29 DIAGNOSIS — I10 Essential (primary) hypertension: Secondary | ICD-10-CM | POA: Diagnosis not present

## 2017-06-29 DIAGNOSIS — Z1231 Encounter for screening mammogram for malignant neoplasm of breast: Secondary | ICD-10-CM

## 2017-06-29 DIAGNOSIS — IMO0001 Reserved for inherently not codable concepts without codable children: Secondary | ICD-10-CM

## 2017-06-29 DIAGNOSIS — Z1239 Encounter for other screening for malignant neoplasm of breast: Secondary | ICD-10-CM

## 2017-06-29 DIAGNOSIS — Z79899 Other long term (current) drug therapy: Secondary | ICD-10-CM | POA: Diagnosis not present

## 2017-06-29 LAB — GLUCOSE, POCT (MANUAL RESULT ENTRY): POC Glucose: 208 mg/dl — AB (ref 70–99)

## 2017-06-29 MED ORDER — SIMVASTATIN 20 MG PO TABS: 20.0000 mg | ORAL_TABLET | Freq: Every day | ORAL | 0 refills | Status: DC

## 2017-06-29 MED ORDER — GLIPIZIDE 5 MG PO TABS: ORAL_TABLET | ORAL | 0 refills | Status: DC

## 2017-06-29 MED ORDER — LISINOPRIL-HYDROCHLOROTHIAZIDE 20-12.5 MG PO TABS: ORAL_TABLET | ORAL | 0 refills | Status: DC

## 2017-06-29 MED ORDER — METFORMIN HCL 500 MG PO TABS: ORAL_TABLET | ORAL | 0 refills | Status: DC

## 2017-06-29 NOTE — Progress Notes (Signed)
Pt has some concerns about Glipizide

## 2017-06-29 NOTE — Progress Notes (Signed)
Subjective:  Patient ID: Michele [name], female    DOB: [birth year]  Age: 67 y.o. MRN: [id number]  CC: Diabetes and Hypertension  HPI Michele [name] is a 67 year old female with a PMH significant HTN, T2DM, and vaginal prolapse that present today for follow-up  1. DM: Endorses adherence to medication. Reports blood sugar ranges 140s-150s fasting and 190s-200s postprandial. Follows a diabetic diet but does not exercise. 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 Denies hypoglycemic episodes, visual changes, and numbness/tingling of extremities. Eye exam was done 2/11 and revealed no diabetic retinopathy.   2.HTN/HLD Checks BP twice a week and reports BP in the 140's. Adheres to BP medication and low sodium diet. Denies chest pain, SOB, or pedal edema.   3. Vaginal Prolapse Follows up with GYN, uses pessary, denies any concerns.  4. Health Maintenance   Unable to obtain records for mammogram, pap smear, and colonoscopy in [place]. She says that she is going this summer and will work on obtaining them. She did receive a call for colonoscopy but never followed-up. Declined pap smear, Tdap, and PNA vaccine today. Is agreeable to a mammogram and  FIT testing today.    Past Medical History:  Diagnosis Date  . Diabetes mellitus without complication ([place])   . Hypertension   No past surgical history on file. No Known Allergies  Outpatient Medications Prior to Visit  Medication Sig Dispense Refill  . Blood Glucose Monitoring Suppl (TRUE METRIX METER) w/Device KIT 1 each by Does not apply route 3 (three) times daily. 1 kit 0  . estradiol (ESTRACE) 0.1 MG/GM vaginal cream Apply 1 gram per vagina every night for 2 weeks, then apply three times a week 30 g 12  . glucose blood test strip Use as instructed 100 each 6  . TRUEPLUS LANCETS 28G MISC 1 each by Does not apply route 3 (three) times daily. 100 each 12  . cephALEXin (KEFLEX) 500 MG capsule Take 1 capsule (500 mg total) by mouth 4  (four) times daily. (Patient not taking: Reported on 01/08/2017) 28 capsule 0  . glipiZIDE (GLUCOTROL) 5 MG tablet TAKE 1 TABLET BY MOUTH EVERY MORNING WITH BREAKFAST AND 2 TABLETS BY MOUTH WITH DINNER 90 tablet 0  . lisinopril-hydrochlorothiazide (PRINZIDE,ZESTORETIC) 20-12.5 MG tablet Take 1 tablet by mouth daily 30 tablet 0  . metFORMIN (GLUCOPHAGE) 500 MG tablet Take 1 tablet (500 mg total) by mouth 2 (two) times daily with a meal. 60 tablet 0  . simvastatin (ZOCOR) 20 MG tablet TAKE 1 TABLET BY MOUTH EVERY DAY 30 tablet 0   No facility-administered medications prior to visit.     ROS Review of Systems  Constitutional: Negative.   Eyes: Negative.   Respiratory: Negative for cough and shortness of breath.   Cardiovascular: Negative for chest pain, palpitations and leg swelling.  Gastrointestinal: Negative.   Endocrine: Negative for polydipsia and polyuria.  Musculoskeletal: Negative.   Neurological: Negative for dizziness, numbness and headaches.  Psychiatric/Behavioral: Negative.     Objective:  BP 129/88 (BP Location: Left Arm)   Pulse 79   Temp 98.2 F (36.8 C) (Oral)   Resp 16   Ht '5\' 3"'$  (1.6 m)   Wt 131 lb 3.2 oz (59.5 kg)   SpO2 96%   BMI 23.24 kg/m   BP/Weight 06/29/2017 01/08/2017 4/00/8676  Systolic BP 195 093 267  Diastolic BP 88 78 87  Wt. (Lbs) 131.2 128.8 -  BMI 23.24 24.34 -   Lab Results  Component Value Date   HGBA1C 5.7 09/28/2016   Lab Results  Component Value Date   POCGLU 359 (A) 12/07/2016   POCGLU 150 (A) 09/28/2016    BMP Latest Ref Rng & Units 09/29/2016  Glucose 65 - 99 mg/dL 110(H)  BUN 8 - 27 mg/dL 7(L)  Creatinine 0.57 - 1.00 mg/dL 0.55(L)  BUN/Creat Ratio 12 - 28 13  Sodium 134 - 144 mmol/L 140  Potassium 3.5 - 5.2 mmol/L 4.0  Chloride 96 - 106 mmol/L 98  CO2 18 - 29 mmol/L 26  Calcium 8.7 - 10.3 mg/dL 10.0    Physical Exam  Constitutional: She is oriented to person, place, and time. She appears well-developed and  well-nourished. No distress.  HENT:  Head: Normocephalic.  Mouth/Throat: Oropharynx is clear and moist.  Eyes: Pupils are equal, round, and reactive to light.  Neck: Normal range of motion. Neck supple.  Cardiovascular: Normal rate, regular rhythm, normal heart sounds and intact distal pulses.  Pulmonary/Chest: Effort normal and breath sounds normal.  Abdominal: Soft. Bowel sounds are normal. There is no tenderness.  Musculoskeletal: Normal range of motion. She exhibits no edema.  Neurological: She is alert and oriented to person, place, and time.  Skin: Skin is warm and dry.  Psychiatric: She has a normal mood and affect. Her behavior is normal. Thought content normal.  Vitals reviewed.    Assessment & Plan:   1. Uncontrolled type 2 diabetes mellitus without complication, without long-term current use of insulin ([facility]) Previously controlled with A1c of 5.6. Increased Metformin from '500mg'$  to '1000mg'$  in the morning. Continue '500mg'$  of metformin at night. Continue glipizide. Encouraged diabetic diet.  - Hemoglobin A1c - POCT glucose (manual entry) - Microalbumin / creatinine urine ratio - glipiZIDE (GLUCOTROL) 5 MG tablet; TAKE 1 TABLET BY MOUTH EVERY MORNING WITH BREAKFAST AND 2 TABLETS BY MOUTH WITH DINNER  Dispense: 90 tablet; Refill: 0 - metFORMIN (GLUCOPHAGE) 500 MG tablet; Take 2 tablets (1,'000mg'$ ) in the morning and 1 tablet ('500mg'$ ) with dinner.  Dispense: 60 tablet; Refill: 0  2. Essential hypertension Previously Uncontrolled.  Instructed patient to check BP every day for 1-2 weeks and call if it continues to be high. BP Medications will be adjusted as needed. Encouraged DASH diet and exercise.  - Basic Metabolic Panel - lisinopril-hydrochlorothiazide (PRINZIDE,ZESTORETIC) 20-12.5 MG tablet; Take 1 tablet by mouth daily  Dispense: 30 tablet; Refill: 0  3. Vaginal prolapse Continue f/u with GYN, uses pessary   4. Colon cancer screening Refused colonoscopy at this time, agreeable  to FIT testing - Fecal occult blood, imunochemical([facility])  5. Pneumococcal vaccine refused Informed of benefits, refused at this time.   6. Tetanus, diphtheria, and acellular pertussis (Tdap) vaccination declined Refused at this time.   7. Hyperlipidemia, unspecified hyperlipidemia type A low fat, low cholesterol is discussed with the patient - simvastatin (ZOCOR) 20 MG tablet; Take 1 tablet (20 mg total) by mouth daily.  Dispense: 30 tablet; Refill: 0  8. Screening for breast cancer - MM Digital Screening; Future   Meds ordered this encounter  Medications  . glipiZIDE (GLUCOTROL) 5 MG tablet    Sig: TAKE 1 TABLET BY MOUTH EVERY MORNING WITH BREAKFAST AND 2 TABLETS BY MOUTH WITH DINNER    Dispense:  90 tablet    Refill:  0  . lisinopril-hydrochlorothiazide (PRINZIDE,ZESTORETIC) 20-12.5 MG tablet    Sig: Take 1 tablet by mouth daily    Dispense:  30 tablet    Refill:  0  . simvastatin (ZOCOR)  20 MG tablet    Sig: Take 1 tablet (20 mg total) by mouth daily.    Dispense:  30 tablet    Refill:  0  . metFORMIN (GLUCOPHAGE) 500 MG tablet    Sig: Take 2 tablets (1,'000mg'$ ) in the morning and 1 tablet ('500mg'$ ) with dinner.    Dispense:  60 tablet    Refill:  0    Follow-up: No Follow-up on file.   Jonnie Kind DNP Student

## 2017-06-30 ENCOUNTER — Telehealth: Payer: Self-pay

## 2017-06-30 LAB — HEMOGLOBIN A1C
Est. average glucose Bld gHb Est-mCnc: 186 mg/dL
Hgb A1c MFr Bld: 8.1 % — ABNORMAL HIGH (ref 4.8–5.6)

## 2017-06-30 LAB — BASIC METABOLIC PANEL
BUN / CREAT RATIO: 12 (ref 12–28)
BUN: 7 mg/dL — ABNORMAL LOW (ref 8–27)
CO2: 24 mmol/L (ref 20–29)
CREATININE: 0.58 mg/dL (ref 0.57–1.00)
Calcium: 10.1 mg/dL (ref 8.7–10.3)
Chloride: 99 mmol/L (ref 96–106)
GFR, EST AFRICAN AMERICAN: 112 mL/min/{1.73_m2} (ref >59)
GFR, EST NON AFRICAN AMERICAN: 97 mL/min/{1.73_m2} (ref >59)
GLUCOSE: 144 mg/dL — AB (ref 65–99)
Potassium: 4.1 mmol/L (ref 3.5–5.2)
Sodium: 142 mmol/L (ref 134–144)

## 2017-06-30 LAB — MICROALBUMIN / CREATININE URINE RATIO
CREATININE, UR: 98.5 mg/dL
MICROALBUM., U, RANDOM: 7.7 ug/mL
Microalb/Creat Ratio: 7.8 mg/g creat (ref 0.0–30.0)

## 2017-06-30 NOTE — Telephone Encounter (Signed)
Contacted pt to go over lab results pt is aware and doesn't have any questions or concerns 

## 2017-07-02 ENCOUNTER — Ambulatory Visit: Payer: Medicaid Other | Admitting: Internal Medicine

## 2017-07-05 ENCOUNTER — Other Ambulatory Visit: Payer: Self-pay | Admitting: Internal Medicine

## 2017-07-05 DIAGNOSIS — E1165 Type 2 diabetes mellitus with hyperglycemia: Principal | ICD-10-CM

## 2017-07-05 DIAGNOSIS — IMO0001 Reserved for inherently not codable concepts without codable children: Secondary | ICD-10-CM

## 2017-07-05 DIAGNOSIS — E785 Hyperlipidemia, unspecified: Secondary | ICD-10-CM

## 2017-07-06 LAB — FECAL OCCULT BLOOD, IMMUNOCHEMICAL: FECAL OCCULT BLD: POSITIVE — AB

## 2017-07-08 ENCOUNTER — Telehealth: Payer: Self-pay

## 2017-07-08 ENCOUNTER — Other Ambulatory Visit: Payer: Self-pay | Admitting: Internal Medicine

## 2017-07-08 DIAGNOSIS — I10 Essential (primary) hypertension: Secondary | ICD-10-CM

## 2017-07-08 NOTE — Telephone Encounter (Signed)
Contacted pt to go over Colon Guard results pt didn't answer left a detailed vm informing pt of the results and if she has any questions or concerns to give me a call  If pt calls back please give results: FIT test was positive for blood. I recommend moving forward with having a colonoscopy for further evaluation of this positive result. Have to be checked for any under laying bowel cancer. Let me know if she is agreeable then I can submit the referral.

## 2017-07-19 ENCOUNTER — Ambulatory Visit: Payer: Medicare Other | Attending: Internal Medicine

## 2017-07-19 DIAGNOSIS — Z111 Encounter for screening for respiratory tuberculosis: Secondary | ICD-10-CM | POA: Insufficient documentation

## 2017-07-19 NOTE — Progress Notes (Signed)
Tuberculin skin test applied to right ventral forearm at 157pm . Patient informed to schedule appt for nurse visit in 48-72 hours to have site read.  Particia LatherJay'A R Keysi Oelkers, RMA

## 2017-07-19 NOTE — Patient Instructions (Signed)
Please schedule an appt for nurse visit in 48-72 hours to have site read.   Tuberculin Skin Test Why am I having this test? Tuberculosis (TB) is a bacterial infection caused by Mycobacterium tuberculosis. Most people who are exposed to these bacteria have a strong enough defense (immune) system to prevent the bacteria from causing TB and developing symptoms. Their bodies prevent the germs from being active and making them sick (latent TB infection). However, if you have TB germs in your body and your immune system is weak, you can develop a TB infection. This can cause symptoms such as:  Night sweats.  Fever.  Weakness.  Weight loss.  A latent TB infection can also become active later in life if your immune system becomes weakened or compromised. You may have this test if your health care provider suspects that you have TB. You may also have this test to screen for TB if you are at risk for getting the disease. Those at increased risk include:  People who inject illegal drugs or share needles.  People with HIV or other diseases that affect immunity.  Health care workers.  People who live in high-risk communities, such as homeless shelters, nursing homes, and correctional facilities.  People who have been in contact with someone with TB.  People from countries where TB is more common.  If you are in a high-risk group, your health care provider may wish to screen for TB more often. This can help prevent the spread of the disease. Sometimes TB screening is required when starting a new job, such as becoming a Scientist, forensichealth care worker or a Runner, broadcasting/film/videoteacher. Colleges or universities may require it of new students. What is being tested? A tuberculin skin test is the main test used to check for exposure to the bacteria that can cause TB. The test checks for antibodies to the bacteria. Antibodies are proteins that your body produces to protect you from germs and other things that can make you sick. Your  health care provider will inject a solution known as PPD (purified protein derivative) under the first layer of skin on your arm. This causes a blister-like bubble to form at the site. Your health care provider will then examine the site after a number of hours have passed to see if a reaction has occurred. How do I prepare for this test? There is no preparation required for this test. What do the results mean? Your test results will be reported as either negative or positive. If the tuberculin skin test produces a negative result, it is likely that you do not have TB and have not been exposed to the TB bacteria. If you or your health care provider suspects exposure, however, you may want to repeat the test a few weeks later. A blood test may also be used to check for TB. This is because you will not react to the tuberculin skin test until several weeks after exposure to TB bacteria. If you test positive to the tuberculin skin test, it is likely that you have been exposed to TB bacteria. The test does not distinguish between an active and a latent TB infection. A false-positive result can occur. A false-positive result for TB bacteria is incorrect because it indicates a condition or finding is present when it is not. Talk to your health care provider to discuss your results, treatment options, and if necessary, the need for more tests. It is your responsibility to obtain your test results. Ask the lab or department  performing the test when and how you will get your results. Talk with your health care provider if you have any questions about your results. Talk with your health care provider to discuss your results, treatment options, and if necessary, the need for more tests. Talk with your health care provider if you have any questions about your results. This information is not intended to replace advice given to you by your health care provider. Make sure you discuss any questions you have with your  health care provider. Document Released: 02/04/2005 Document Revised: 12/29/2015 Document Reviewed: 08/21/2013 Elsevier Interactive Patient Education  [employer].

## 2017-07-21 ENCOUNTER — Ambulatory Visit: Payer: Medicare Other | Attending: Internal Medicine | Admitting: *Deleted

## 2017-07-21 DIAGNOSIS — Z111 Encounter for screening for respiratory tuberculosis: Secondary | ICD-10-CM

## 2017-07-21 LAB — TB SKIN TEST
INDURATION: 0 mm
TB SKIN TEST: NEGATIVE

## 2017-07-21 NOTE — Patient Instructions (Signed)
Patient received a letter of results

## 2017-08-16 ENCOUNTER — Other Ambulatory Visit: Payer: Self-pay

## 2017-08-16 ENCOUNTER — Telehealth: Payer: Self-pay | Admitting: Internal Medicine

## 2017-08-16 DIAGNOSIS — IMO0001 Reserved for inherently not codable concepts without codable children: Secondary | ICD-10-CM

## 2017-08-16 DIAGNOSIS — E785 Hyperlipidemia, unspecified: Secondary | ICD-10-CM

## 2017-08-16 DIAGNOSIS — E1165 Type 2 diabetes mellitus with hyperglycemia: Principal | ICD-10-CM

## 2017-08-16 DIAGNOSIS — I10 Essential (primary) hypertension: Secondary | ICD-10-CM

## 2017-08-16 MED ORDER — LISINOPRIL-HYDROCHLOROTHIAZIDE 20-12.5 MG PO TABS: ORAL_TABLET | ORAL | 0 refills | Status: DC

## 2017-08-16 MED ORDER — GLIPIZIDE 5 MG PO TABS: ORAL_TABLET | ORAL | 0 refills | Status: DC

## 2017-08-16 MED ORDER — METFORMIN HCL 500 MG PO TABS: ORAL_TABLET | ORAL | 0 refills | Status: DC

## 2017-08-16 MED ORDER — SIMVASTATIN 20 MG PO TABS: 20.0000 mg | ORAL_TABLET | Freq: Every day | ORAL | 0 refills | Status: DC

## 2017-08-16 NOTE — Telephone Encounter (Signed)
1 page, paperwork received through fax 08-16-17. °

## 2017-08-19 MED ORDER — SIMVASTATIN 20 MG PO TABS: 20.0000 mg | ORAL_TABLET | Freq: Every day | ORAL | 5 refills | Status: DC

## 2017-08-19 MED ORDER — METFORMIN HCL 500 MG PO TABS: ORAL_TABLET | ORAL | 5 refills | Status: DC

## 2017-08-19 MED ORDER — GLIPIZIDE 5 MG PO TABS: ORAL_TABLET | ORAL | 5 refills | Status: DC

## 2017-08-19 MED ORDER — LISINOPRIL-HYDROCHLOROTHIAZIDE 20-12.5 MG PO TABS: ORAL_TABLET | ORAL | 5 refills | Status: DC

## 2017-08-19 NOTE — Addendum Note (Signed)
Addended by: Jonah BlueJOHNSON, DEBORAH B on: 08/19/2017 10:16 PM   Modules accepted: Orders

## 2017-08-19 NOTE — Progress Notes (Signed)
RF Metformin, Glucotrol, Zocor and Lisinopril/HCTZ.

## 2017-10-25 ENCOUNTER — Other Ambulatory Visit: Payer: Self-pay | Admitting: Internal Medicine

## 2017-10-25 DIAGNOSIS — E119 Type 2 diabetes mellitus without complications: Secondary | ICD-10-CM

## 2017-11-03 ENCOUNTER — Other Ambulatory Visit: Payer: Self-pay

## 2017-11-03 DIAGNOSIS — R69 Illness, unspecified: Secondary | ICD-10-CM | POA: Diagnosis not present

## 2017-11-03 MED ORDER — GLUCOSE BLOOD VI STRP: ORAL_STRIP | 12 refills | Status: DC

## 2017-12-31 ENCOUNTER — Telehealth: Payer: Self-pay

## 2017-12-31 NOTE — Telephone Encounter (Signed)
error 

## 2018-01-16 ENCOUNTER — Other Ambulatory Visit: Payer: Self-pay | Admitting: Internal Medicine

## 2018-01-16 DIAGNOSIS — E1165 Type 2 diabetes mellitus with hyperglycemia: Principal | ICD-10-CM

## 2018-01-16 DIAGNOSIS — IMO0001 Reserved for inherently not codable concepts without codable children: Secondary | ICD-10-CM

## 2018-01-20 ENCOUNTER — Telehealth: Payer: Self-pay | Admitting: Internal Medicine

## 2018-01-20 DIAGNOSIS — E1165 Type 2 diabetes mellitus with hyperglycemia: Principal | ICD-10-CM

## 2018-01-20 DIAGNOSIS — IMO0001 Reserved for inherently not codable concepts without codable children: Secondary | ICD-10-CM

## 2018-01-20 NOTE — Telephone Encounter (Signed)
1) Medication(s) Requested (by name): metFORMIN (GLUCOPHAGE) 500 MG tablet [[id number]][[id number]]  glucose blood test strip [[id number]][[id number]]   2) Pharmacy of [facility]: [id number] - [place], [place] - [address] 3) Special Requests:   Approved medications will be sent to the pharmacy, we will reach out if there is an issue.  Requests made after 3pm may not be addressed until the following business day!  If a patient is unsure of the name of the medication(s) please note and ask patient to call back when they are able to provide all info, do not send to responsible party until all information is available!

## 2018-01-21 ENCOUNTER — Other Ambulatory Visit: Payer: Self-pay | Admitting: Internal Medicine

## 2018-01-21 DIAGNOSIS — E1165 Type 2 diabetes mellitus with hyperglycemia: Principal | ICD-10-CM

## 2018-01-21 DIAGNOSIS — IMO0001 Reserved for inherently not codable concepts without codable children: Secondary | ICD-10-CM

## 2018-01-21 MED ORDER — METFORMIN HCL 500 MG PO TABS: ORAL_TABLET | ORAL | 0 refills | Status: DC

## 2018-01-21 NOTE — Addendum Note (Signed)
Addended by: Lou CalFRANTZ, Timotheus Salm G on: 01/21/2018 09:04 AM   Modules accepted: Orders

## 2018-01-21 NOTE — Telephone Encounter (Signed)
RX sent for metformin, RX at [facility] in [place] for test strips, pt [facility] call [facility] if she would like [facility] transfer. She will also need an appt before additional refills can be authorized.

## 2018-01-23 DIAGNOSIS — R69 Illness, unspecified: Secondary | ICD-10-CM | POA: Diagnosis not present

## 2018-02-07 ENCOUNTER — Other Ambulatory Visit: Payer: Self-pay | Admitting: Internal Medicine

## 2018-02-07 DIAGNOSIS — I10 Essential (primary) hypertension: Secondary | ICD-10-CM

## 2018-02-08 ENCOUNTER — Other Ambulatory Visit: Payer: Self-pay | Admitting: Internal Medicine

## 2018-02-08 DIAGNOSIS — E785 Hyperlipidemia, unspecified: Secondary | ICD-10-CM

## 2018-02-14 ENCOUNTER — Other Ambulatory Visit: Payer: Self-pay | Admitting: Internal Medicine

## 2018-02-14 DIAGNOSIS — IMO0001 Reserved for inherently not codable concepts without codable children: Secondary | ICD-10-CM

## 2018-02-14 DIAGNOSIS — E1165 Type 2 diabetes mellitus with hyperglycemia: Principal | ICD-10-CM

## 2018-02-25 ENCOUNTER — Ambulatory Visit: Payer: Medicare HMO | Attending: Internal Medicine | Admitting: Internal Medicine

## 2018-02-25 ENCOUNTER — Encounter: Payer: Self-pay | Admitting: Internal Medicine

## 2018-02-25 VITALS — BP 137/82 | HR 72 | Temp 98.2°F | Resp 16 | Wt 133.2 lb

## 2018-02-25 DIAGNOSIS — E1165 Type 2 diabetes mellitus with hyperglycemia: Secondary | ICD-10-CM | POA: Diagnosis not present

## 2018-02-25 DIAGNOSIS — Z7984 Long term (current) use of oral hypoglycemic drugs: Secondary | ICD-10-CM | POA: Diagnosis not present

## 2018-02-25 DIAGNOSIS — IMO0001 Reserved for inherently not codable concepts without codable children: Secondary | ICD-10-CM

## 2018-02-25 DIAGNOSIS — Z1211 Encounter for screening for malignant neoplasm of colon: Secondary | ICD-10-CM | POA: Diagnosis not present

## 2018-02-25 DIAGNOSIS — I10 Essential (primary) hypertension: Secondary | ICD-10-CM | POA: Insufficient documentation

## 2018-02-25 DIAGNOSIS — E119 Type 2 diabetes mellitus without complications: Secondary | ICD-10-CM

## 2018-02-25 DIAGNOSIS — Z23 Encounter for immunization: Secondary | ICD-10-CM | POA: Insufficient documentation

## 2018-02-25 DIAGNOSIS — E785 Hyperlipidemia, unspecified: Secondary | ICD-10-CM | POA: Diagnosis not present

## 2018-02-25 DIAGNOSIS — Z1239 Encounter for other screening for malignant neoplasm of breast: Secondary | ICD-10-CM | POA: Diagnosis not present

## 2018-02-25 DIAGNOSIS — Z79899 Other long term (current) drug therapy: Secondary | ICD-10-CM | POA: Insufficient documentation

## 2018-02-25 LAB — POCT GLYCOSYLATED HEMOGLOBIN (HGB A1C): HbA1c, POC (controlled diabetic range): 9.3 % — AB (ref 0.0–7.0)

## 2018-02-25 LAB — GLUCOSE, POCT (MANUAL RESULT ENTRY): POC Glucose: 164 mg/dl — AB (ref 70–99)

## 2018-02-25 MED ORDER — SIMVASTATIN 20 MG PO TABS: 20.0000 mg | ORAL_TABLET | Freq: Every day | ORAL | 6 refills | Status: DC

## 2018-02-25 MED ORDER — GLIPIZIDE 10 MG PO TABS: 10.0000 mg | ORAL_TABLET | Freq: Two times a day (BID) | ORAL | 6 refills | Status: DC

## 2018-02-25 MED ORDER — LISINOPRIL-HYDROCHLOROTHIAZIDE 20-12.5 MG PO TABS: 1.0000 | ORAL_TABLET | Freq: Every day | ORAL | 6 refills | Status: DC

## 2018-02-25 MED ORDER — GLUCOSE BLOOD VI STRP: ORAL_STRIP | 3 refills | Status: DC

## 2018-02-25 MED ORDER — METFORMIN HCL 1000 MG PO TABS: 1000.0000 mg | ORAL_TABLET | Freq: Two times a day (BID) | ORAL | 6 refills | Status: DC

## 2018-02-25 NOTE — Progress Notes (Signed)
Patient ID: Michele [name], female    DOB: 04/18/1951  MRN: [id number]  CC: No chief complaint on file.   Subjective: Michele [name] is a 67 y.o. female who presents for chronic disease management Her concerns today include:  Patient with history of diabetes type 2, HTN, hyperlipidemia, vaginal prolapse  DM:  Reports BS 150-160 in a.m and over 200 at bedtime.  Would like to change rxn for stripes so that she is able to continue checking her blood sugars twice a day- One touch.  She tries to avoid foods that inc her BS like rice and juices She is active and walks up and down a flight of stairs Med: compliant with meds that include metformin and glipizide Last eye exam was 6 mths ago  HTN: compliant with lisinopril/HCTZ.  She limits salt in the foods. No CP/SOB/LE edema  HL: compliant with Zocor.  Denies any muscle aches or pains  HM: due for flu shot which she is agreeable to receiving today.  Patient Active Problem List   Diagnosis Date Noted  . Complete uterine prolapse 10/27/2016  . Controlled type 2 diabetes mellitus without complication, without long-term current use of insulin ([place]) 09/28/2016  . Essential hypertension 09/28/2016  . Hyperlipidemia 09/28/2016  . Vaginal prolapse 09/28/2016     Current Outpatient Medications on File Prior to Visit  Medication Sig Dispense Refill  . Blood Glucose Monitoring Suppl (TRUE METRIX METER) w/Device KIT 1 each by Does not apply route 3 (three) times daily. 1 kit 0  . estradiol (ESTRACE) 0.1 MG/GM vaginal cream Apply 1 gram per vagina every night for 2 weeks, then apply three times a week 30 g 12  . glucose blood test strip Use as instructed 100 each 12  . TRUE METRIX BLOOD GLUCOSE TEST test strip USE AS DIRECTED 100 each 0  . TRUEPLUS LANCETS 28G MISC 1 each by Does not apply route 3 (three) times daily. 100 each 12   No current facility-administered medications on file prior to visit.     No Known Allergies  Social  History   Socioeconomic History  . Marital status: Married    Spouse name: Not on file  . Number of children: 2  . Years of education: Not on file  . Highest education level: Not on file  Occupational History  . Occupation: LPN  Social Needs  . Financial resource strain: Not on file  . Food insecurity:    Worry: Not on file    Inability: Not on file  . Transportation needs:    Medical: Not on file    Non-medical: Not on file  Tobacco Use  . Smoking status: Never Smoker  . Smokeless tobacco: Never Used  Substance and Sexual Activity  . Alcohol use: No  . Drug use: No  . Sexual activity: Yes  Lifestyle  . Physical activity:    Days per week: Not on file    Minutes per session: Not on file  . Stress: Not on file  Relationships  . Social connections:    Talks on phone: Not on file    Gets together: Not on file    Attends religious service: Not on file    Active member of club or organization: Not on file    Attends meetings of clubs or organizations: Not on file    Relationship status: Not on file  . Intimate partner violence:    Fear of current or ex partner: Not on file  Emotionally abused: Not on file    Physically abused: Not on file    Forced sexual activity: Not on file  Other Topics Concern  . Not on file  Social History Narrative  . Not on file    Family History  Problem Relation Age of Onset  . Diabetes Mother   . Hypertension Mother   . Diabetes Father   . Hypertension Father   . Diabetes Brother   . Hypertension Brother     No past surgical history on file.  ROS: Review of Systems Negative except as above. PHYSICAL EXAM: BP 137/82   Pulse 72   Temp 98.2 F (36.8 C) (Oral)   Resp 16   Wt 133 lb 3.2 oz (60.4 kg)   SpO2 98%   BMI 23.60 kg/m   Physical Exam  General appearance - alert, well appearing, and in no distress Mental status - normal mood, behavior, speech, dress, motor activity, and thought processes Neck - supple, no  significant adenopathy Chest - clear to auscultation, no wheezes, rales or rhonchi, symmetric air entry Heart - normal rate, regular rhythm, normal S1, S2, no murmurs, rubs, clicks or gallops Extremities - peripheral pulses normal, no pedal edema, no clubbing or cyanosis  Results for orders placed or performed in visit on 02/25/18  POCT glucose (manual entry)  Result Value Ref Range   POC Glucose 164 (A) 70 - 99 mg/dl  POCT glycosylated hemoglobin (Hb A1C)  Result Value Ref Range   Hemoglobin A1C     HbA1c POC (<> result, manual entry)     HbA1c, POC (prediabetic range)     HbA1c, POC (controlled diabetic range) 9.3 (A) 0.0 - 7.0 %    ASSESSMENT AND PLAN: 1. Diabetes mellitus type 2, uncontrolled, without complications (HCC) Discussed healthy eating habits.  Increase glipizide to 10 mg twice a day.  Increase metformin to 1000 mg twice a day.  Continue to monitor blood sugars twice daily. - POCT glucose (manual entry) - POCT glycosylated hemoglobin (Hb A1C) - CBC - Comprehensive metabolic panel - Lipid panel - glipiZIDE (GLUCOTROL) 10 MG tablet; Take 1 tablet (10 mg total) by mouth 2 (two) times daily.  Dispense: 60 tablet; Refill: 6 - metFORMIN (GLUCOPHAGE) 1000 MG tablet; Take 1 tablet (1,000 mg total) by mouth 2 (two) times daily with a meal.  Dispense: 60 tablet; Refill: 6  2. Essential hypertension Close to goal.  Continue lisinopril/HCTZ. - lisinopril-hydrochlorothiazide (PRINZIDE,ZESTORETIC) 20-12.5 MG tablet; Take 1 tablet by mouth daily. MUST KEEP APPT ON 02/25/18 FOR FURTHER REFILLS.  Dispense: 30 tablet; Refill: 6  3. Hyperlipidemia, unspecified hyperlipidemia type - simvastatin (ZOCOR) 20 MG tablet; Take 1 tablet (20 mg total) by mouth daily. MUST KEEP APPT ON 02/25/18 FOR FURTHER REFILLS  Dispense: 30 tablet; Refill: 6  4. Colon cancer screening - Ambulatory referral to Gastroenterology  5. Breast cancer screening - MM Digital Screening; Future  6. Need for  immunization against influenza - Flu Vaccine QUAD 36+ mos IM   Patient was given the opportunity to ask questions.  Patient verbalized understanding of the plan and was able to repeat key elements of the plan.   Orders Placed This Encounter  Procedures  . MM Digital Screening  . Flu Vaccine QUAD 36+ mos IM  . CBC  . Comprehensive metabolic panel  . Lipid panel  . Ambulatory referral to Gastroenterology  . POCT glucose (manual entry)  . POCT glycosylated hemoglobin (Hb A1C)     Requested Prescriptions  Signed Prescriptions Disp Refills  . glipiZIDE (GLUCOTROL) 10 MG tablet 60 tablet 6    Sig: Take 1 tablet (10 mg total) by mouth 2 (two) times daily.  . metFORMIN (GLUCOPHAGE) 1000 MG tablet 60 tablet 6    Sig: Take 1 tablet (1,000 mg total) by mouth 2 (two) times daily with a meal.  . lisinopril-hydrochlorothiazide (PRINZIDE,ZESTORETIC) 20-12.5 MG tablet 30 tablet 6    Sig: Take 1 tablet by mouth daily. MUST KEEP APPT ON 02/25/18 FOR FURTHER REFILLS.  Marland Kitchen simvastatin (ZOCOR) 20 MG tablet 30 tablet 6    Sig: Take 1 tablet (20 mg total) by mouth daily. MUST KEEP APPT ON 02/25/18 FOR FURTHER REFILLS    Return in about 4 months (around 06/28/2018).  Karle Plumber, MD, FACP

## 2018-02-25 NOTE — Patient Instructions (Addendum)
Increase metformin to thousand milligrams twice a day.  Increase glipizide to 10 mg twice a day.  Continue to monitor your blood sugars twice a day.  Influenza Virus Vaccine injection (Fluarix) What is this medicine? INFLUENZA VIRUS VACCINE (in floo EN zuh VAHY ruhs vak SEEN) helps to reduce the risk of getting influenza also known as the flu. This medicine may be used for other purposes; ask your health care provider or pharmacist if you have questions. COMMON BRAND NAME(S): Fluarix, Fluzone What should I tell my health care provider before I take this medicine? They need to know if you have any of these conditions: -bleeding disorder like hemophilia -fever or infection -Guillain-Barre syndrome or other neurological problems -immune system problems -infection with the human immunodeficiency virus (HIV) or AIDS -low blood platelet counts -multiple sclerosis -an unusual or allergic reaction to influenza virus vaccine, eggs, chicken proteins, latex, gentamicin, other medicines, foods, dyes or preservatives -pregnant or trying to get pregnant -breast-feeding How should I use this medicine? This vaccine is for injection into a muscle. It is given by a health care professional. A copy of Vaccine Information Statements will be given before each vaccination. Read this sheet carefully each time. The sheet may change frequently. Talk to your pediatrician regarding the use of this medicine in children. Special care may be needed. Overdosage: If you think you have taken too much of this medicine contact a poison control center or emergency room at once. NOTE: This medicine is only for you. Do not share this medicine with others. What if I miss a dose? This does not apply. What may interact with this medicine? -chemotherapy or radiation therapy -medicines that lower your immune system like etanercept, anakinra, infliximab, and adalimumab -medicines that treat or prevent blood clots like  warfarin -phenytoin -steroid medicines like prednisone or cortisone -theophylline -vaccines This list may not describe all possible interactions. Give your health care provider a list of all the medicines, herbs, non-prescription drugs, or dietary supplements you use. Also tell them if you smoke, drink alcohol, or use illegal drugs. Some items may interact with your medicine. What should I watch for while using this medicine? Report any side effects that do not go away within 3 days to your doctor or health care professional. Call your health care provider if any unusual symptoms occur within 6 weeks of receiving this vaccine. You may still catch the flu, but the illness is not usually as bad. You cannot get the flu from the vaccine. The vaccine will not protect against colds or other illnesses that may cause fever. The vaccine is needed every year. What side effects may I notice from receiving this medicine? Side effects that you should report to your doctor or health care professional as soon as possible: -allergic reactions like skin rash, itching or hives, swelling of the face, lips, or tongue Side effects that usually do not require medical attention (report to your doctor or health care professional if they continue or are bothersome): -fever -headache -muscle aches and pains -pain, tenderness, redness, or swelling at site where injected -weak or tired This list may not describe all possible side effects. Call your doctor for medical advice about side effects. You may report side effects to FDA at 1-800-FDA-1088. Where should I keep my medicine? This vaccine is only given in a clinic, pharmacy, doctor's office, or other health care setting and will not be stored at home. NOTE: This sheet is a summary. It may not cover all possible  information. If you have questions about this medicine, talk to your doctor, pharmacist, or health care provider.  2018 Elsevier/Gold Standard (2007-11-23  09:30:40)

## 2018-02-26 LAB — COMPREHENSIVE METABOLIC PANEL
ALBUMIN: 4.2 g/dL (ref 3.6–4.8)
ALT: 35 IU/L — AB (ref 0–32)
AST: 34 IU/L (ref 0–40)
Albumin/Globulin Ratio: 1.6 (ref 1.2–2.2)
Alkaline Phosphatase: 77 IU/L (ref 39–117)
BUN / CREAT RATIO: 15 (ref 12–28)
BUN: 9 mg/dL (ref 8–27)
Bilirubin Total: <0.2 mg/dL (ref 0.0–1.2)
CALCIUM: 9.6 mg/dL (ref 8.7–10.3)
CO2: 23 mmol/L (ref 20–29)
Chloride: 97 mmol/L (ref 96–106)
Creatinine, Ser: 0.6 mg/dL (ref 0.57–1.00)
GFR, EST AFRICAN AMERICAN: 110 mL/min/{1.73_m2} (ref >59)
GFR, EST NON AFRICAN AMERICAN: 95 mL/min/{1.73_m2} (ref >59)
GLUCOSE: 165 mg/dL — AB (ref 65–99)
Globulin, Total: 2.6 g/dL (ref 1.5–4.5)
Potassium: 3.8 mmol/L (ref 3.5–5.2)
Sodium: 138 mmol/L (ref 134–144)
TOTAL PROTEIN: 6.8 g/dL (ref 6.0–8.5)

## 2018-02-26 LAB — CBC
HEMOGLOBIN: 13 g/dL (ref 11.1–15.9)
Hematocrit: 39.5 % (ref 34.0–46.6)
MCH: 27.4 pg (ref 26.6–33.0)
MCHC: 32.9 g/dL (ref 31.5–35.7)
MCV: 83 fL (ref 79–97)
PLATELETS: 380 10*3/uL (ref 150–450)
RBC: 4.75 x10E6/uL (ref 3.77–5.28)
RDW: 13.2 % (ref 12.3–15.4)
WBC: 11.3 10*3/uL — AB (ref 3.4–10.8)

## 2018-02-26 LAB — LIPID PANEL
CHOLESTEROL TOTAL: 158 mg/dL (ref 100–199)
Chol/HDL Ratio: 2.9 ratio (ref 0.0–4.4)
HDL: 55 mg/dL (ref >39)
LDL CALC: 28 mg/dL (ref 0–99)
TRIGLYCERIDES: 377 mg/dL — AB (ref 0–149)
VLDL CHOLESTEROL CAL: 75 mg/dL — AB (ref 5–40)

## 2018-02-28 ENCOUNTER — Encounter: Payer: Self-pay | Admitting: Gastroenterology

## 2018-03-01 ENCOUNTER — Telehealth: Payer: Self-pay

## 2018-03-01 NOTE — Telephone Encounter (Signed)
Contacted pt to go over lab results pt didn't answer lvm asking pt to give me a call at her earliest convenience  If pt calls back please give results: blood count is okay. Kidney function normal. Mild elevation in one of the liver enzymes. Will observe for now. LDL and total cholesterol are nl. TG elevated. Recommend continue healthy eating habits and regular exercise.

## 2018-03-06 ENCOUNTER — Other Ambulatory Visit: Payer: Self-pay | Admitting: Internal Medicine

## 2018-03-06 DIAGNOSIS — E1165 Type 2 diabetes mellitus with hyperglycemia: Principal | ICD-10-CM

## 2018-03-06 DIAGNOSIS — IMO0001 Reserved for inherently not codable concepts without codable children: Secondary | ICD-10-CM

## 2018-03-10 ENCOUNTER — Other Ambulatory Visit: Payer: Self-pay | Admitting: Internal Medicine

## 2018-03-10 DIAGNOSIS — E785 Hyperlipidemia, unspecified: Secondary | ICD-10-CM

## 2018-04-15 ENCOUNTER — Encounter: Payer: Medicare HMO | Admitting: Gastroenterology

## 2018-05-06 ENCOUNTER — Ambulatory Visit: Payer: Medicare HMO

## 2018-05-18 DIAGNOSIS — R69 Illness, unspecified: Secondary | ICD-10-CM | POA: Diagnosis not present

## 2018-08-14 ENCOUNTER — Other Ambulatory Visit: Payer: Self-pay | Admitting: Internal Medicine

## 2018-08-14 DIAGNOSIS — E785 Hyperlipidemia, unspecified: Secondary | ICD-10-CM

## 2018-08-14 DIAGNOSIS — R69 Illness, unspecified: Secondary | ICD-10-CM | POA: Diagnosis not present

## 2018-08-18 ENCOUNTER — Other Ambulatory Visit: Payer: Self-pay | Admitting: Internal Medicine

## 2018-08-18 DIAGNOSIS — E785 Hyperlipidemia, unspecified: Secondary | ICD-10-CM

## 2018-08-31 ENCOUNTER — Other Ambulatory Visit: Payer: Self-pay | Admitting: Internal Medicine

## 2018-08-31 DIAGNOSIS — I10 Essential (primary) hypertension: Secondary | ICD-10-CM

## 2018-09-21 ENCOUNTER — Other Ambulatory Visit: Payer: Self-pay | Admitting: Internal Medicine

## 2018-09-21 DIAGNOSIS — IMO0001 Reserved for inherently not codable concepts without codable children: Secondary | ICD-10-CM

## 2018-10-03 ENCOUNTER — Other Ambulatory Visit: Payer: Self-pay | Admitting: Internal Medicine

## 2018-10-03 DIAGNOSIS — I10 Essential (primary) hypertension: Secondary | ICD-10-CM

## 2018-11-04 ENCOUNTER — Other Ambulatory Visit: Payer: Self-pay | Admitting: Internal Medicine

## 2018-11-04 DIAGNOSIS — I10 Essential (primary) hypertension: Secondary | ICD-10-CM

## 2018-11-08 ENCOUNTER — Other Ambulatory Visit: Payer: Self-pay | Admitting: Internal Medicine

## 2018-11-08 DIAGNOSIS — E785 Hyperlipidemia, unspecified: Secondary | ICD-10-CM

## 2018-11-12 ENCOUNTER — Other Ambulatory Visit: Payer: Self-pay | Admitting: Internal Medicine

## 2018-11-12 DIAGNOSIS — E119 Type 2 diabetes mellitus without complications: Secondary | ICD-10-CM

## 2018-11-14 ENCOUNTER — Telehealth: Payer: Self-pay | Admitting: Internal Medicine

## 2018-11-14 DIAGNOSIS — I10 Essential (primary) hypertension: Secondary | ICD-10-CM

## 2018-11-14 DIAGNOSIS — R69 Illness, unspecified: Secondary | ICD-10-CM | POA: Diagnosis not present

## 2018-11-14 NOTE — Telephone Encounter (Signed)
Called patient to inform that the order for covid-19 screening was sent to test center. The appointment will be made by the test site and they will call to  inform of the appointment.  Also given test site location and phone number to [facility].  Pt verbalized understanding.

## 2018-11-14 NOTE — Telephone Encounter (Signed)
Patient called stating that she is about to travel outside of the [place] and they are requiring that she get tested for COVID before she is able to go on board. Please follow up.

## 2018-11-15 ENCOUNTER — Telehealth: Payer: Self-pay | Admitting: *Deleted

## 2018-11-15 ENCOUNTER — Encounter: Payer: Self-pay | Admitting: *Deleted

## 2018-11-15 DIAGNOSIS — Z20822 Contact with and (suspected) exposure to covid-19: Secondary | ICD-10-CM

## 2018-11-15 NOTE — Telephone Encounter (Signed)
-----   Message from Travia Benjamin, RN sent at 11/14/2018  3:28 PM EDT ----- Regarding: COVID 19 testing Screening required  for travel out of the country-   P/F Valvo,[name] [facility]  Show all cvgs   Add Coverage      Guarantor Demographics Address linked to patient Home: [phone] Rel to patient: Self     Work:    Additional Guarantor Info  Employment:None   Prof acct balance: 0.00 Hosp acct balance: -25.00 Add Account Note                            1. E-AETNA MED#/AETNA MEDICARE#    E-Verified Response History       Subscriber Demographics [name],Michele Home:     Address same as patient Work:               Coverage Info Member ID: [id number] Group:  PPO PREMIER PLU# Rel to subscriber: Self    Subscriber ID: [id number] Effective from: 09/08/2017 Auth phone:     

## 2018-11-15 NOTE — Telephone Encounter (Signed)
Left voicemail for patient to call back to schedule COVID 19 test from 7 am- 7 pm Monday - Friday.  Test ordered.

## 2018-11-15 NOTE — Telephone Encounter (Signed)
Patient returned call.  Scheduled her for COVID 19 test tomorrow at 11 am at [facility].  Testing protocol reviewed.

## 2018-11-15 NOTE — Telephone Encounter (Signed)
-----   Message from Carilyn Goodpasture, RN sent at 11/14/2018  3:28 PM EDT ----- Regarding: COVID 19 testing Screening required  for travel out of the country-   P/F [name],Michele [[id number]] [facility]  Show all cvgs   Add Coverage      Guarantor Demographics Address linked to patient Home: [phone] Rel to patient: Self     Work:    Art therapist  Employment:None   Prof acct balance: 0.00 Hosp acct balance: -25.00 Add Account Note                            1. E-AETNA MED#/AETNA MEDICARE#    E-Verified Response History       Subscriber Demographics [name],Michele Home:     Address same as patient Work:               Primary school teacher ID: [id number] Group:  PPO PREMIER PLU# Rel to subscriber: Self    Subscriber ID: [id number] Effective from: 09/08/2017 Auth phone:

## 2018-11-15 NOTE — Telephone Encounter (Signed)
Opened in error

## 2018-11-16 ENCOUNTER — Other Ambulatory Visit: Payer: Medicare HMO

## 2018-11-16 DIAGNOSIS — R6889 Other general symptoms and signs: Secondary | ICD-10-CM | POA: Diagnosis not present

## 2018-11-16 DIAGNOSIS — Z20822 Contact with and (suspected) exposure to covid-19: Secondary | ICD-10-CM

## 2018-11-18 ENCOUNTER — Ambulatory Visit: Payer: Medicare HMO | Attending: Internal Medicine | Admitting: Internal Medicine

## 2018-11-18 ENCOUNTER — Other Ambulatory Visit: Payer: Self-pay

## 2018-11-18 ENCOUNTER — Encounter: Payer: Self-pay | Admitting: Internal Medicine

## 2018-11-18 VITALS — BP 155/90 | HR 90

## 2018-11-18 DIAGNOSIS — IMO0001 Reserved for inherently not codable concepts without codable children: Secondary | ICD-10-CM

## 2018-11-18 DIAGNOSIS — Z1211 Encounter for screening for malignant neoplasm of colon: Secondary | ICD-10-CM | POA: Diagnosis not present

## 2018-11-18 DIAGNOSIS — I10 Essential (primary) hypertension: Secondary | ICD-10-CM | POA: Diagnosis not present

## 2018-11-18 DIAGNOSIS — E1165 Type 2 diabetes mellitus with hyperglycemia: Secondary | ICD-10-CM | POA: Diagnosis not present

## 2018-11-18 DIAGNOSIS — Z1239 Encounter for other screening for malignant neoplasm of breast: Secondary | ICD-10-CM

## 2018-11-18 DIAGNOSIS — Z7189 Other specified counseling: Secondary | ICD-10-CM

## 2018-11-18 DIAGNOSIS — E785 Hyperlipidemia, unspecified: Secondary | ICD-10-CM | POA: Diagnosis not present

## 2018-11-18 MED ORDER — SITAGLIPTIN PHOSPHATE 50 MG PO TABS: 50.0000 mg | ORAL_TABLET | Freq: Every day | ORAL | 5 refills | Status: DC

## 2018-11-18 MED ORDER — LISINOPRIL-HYDROCHLOROTHIAZIDE 20-12.5 MG PO TABS: 1.0000 | ORAL_TABLET | Freq: Every day | ORAL | 3 refills | Status: DC

## 2018-11-18 MED ORDER — SIMVASTATIN 20 MG PO TABS: 20.0000 mg | ORAL_TABLET | Freq: Every day | ORAL | 1 refills | Status: DC

## 2018-11-18 MED ORDER — LISINOPRIL-HYDROCHLOROTHIAZIDE 20-12.5 MG PO TABS: 1.0000 | ORAL_TABLET | Freq: Every day | ORAL | 1 refills | Status: DC

## 2018-11-18 MED ORDER — METFORMIN HCL 1000 MG PO TABS: 1000.0000 mg | ORAL_TABLET | Freq: Two times a day (BID) | ORAL | 6 refills | Status: DC

## 2018-11-18 NOTE — Progress Notes (Signed)
Patient verified DOB Patient has been out of BP medication for a week. Patient has eaten today. Patient denies pain at this time. Patient will be going out of town and would like to pick up BP medications which were sent to [facility] pharmacy.

## 2018-11-18 NOTE — Progress Notes (Signed)
Virtual Visit via Telephone Note Due to current restrictions/limitations of in-office visits due to the COVID-19 pandemic, this scheduled clinical appointment was converted to a telehealth visit  I connected with Michele [name] on 11/18/18 at 12:01 p.m EDT by telephone and verified that I am speaking with the correct person using two identifiers. I am in my office.  The patient is at home.  Only the patient and myself participated in this encounter.  I discussed the limitations, risks, security and privacy concerns of performing an evaluation and management service by telephone and the availability of in person appointments. I also discussed with the patient that there may be a patient responsible charge related to this service. The patient expressed understanding and agreed to proceed.  History of Present Illness: Patient with history of diabetes type 2, HTN, HL, vaginal prolapse.  Last seen 02/2018.    Pt will be travelling to [place] for a friend's daughter graduation.  Needs COVID testing prior to travel.  Will be leaving on the 11/28/2018.    HTN:  Out of BP med for more than 1 wk Has home BP device but has not been checking To limit salt in the food. Denies chest pain/shortness of breath/lower extremity edema/chronic headaches/dizziness  DM:  Reports compliance with meds - Metformin and Glucotrol.  Afraid of continuing Metformin because of some of the things she has read about it.  When asked about particulars she heard that the extended release has been recalled.  She denies any side effects from the medication like bloating or diarrhea. -checks BS once a day in a.m before BF and sometimes at bedtime.  Range in a.m 140-160.  Once last wk BS was in the 200 in a.m;  At bedtime 150-215 -Eating habits:  "I try to avoid carbs and sugar things." Drinks mainly water, loves fruits -Exercise:  "not exercising that much." But states she goes up and down steps several times a day.  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  Vision:  No blurred vision.  Over due for eye exam No numbness in hands or feet  HL:  Tolerating Zocor  HM:  Referred for c-scope on last visit.  Pt cancel appt because she was not sure that insurance covered it.  Wants to do FIT test again instead.   Referred for MMG on last visit  Outpatient Encounter Medications as of 11/18/2018  Medication Sig  . Blood Glucose Monitoring Suppl (TRUE METRIX METER) w/Device KIT 1 each by Does not apply route 3 (three) times daily.  Marland Kitchen estradiol (ESTRACE) 0.1 MG/GM vaginal cream Apply 1 gram per vagina every night for 2 weeks, then apply three times a week  . glipiZIDE (GLUCOTROL) 10 MG tablet TAKE 1 TABLET BY MOUTH TWICE A DAY  . lisinopril-hydrochlorothiazide (ZESTORETIC) 20-12.5 MG tablet Take 1 tablet by mouth daily.  . metFORMIN (GLUCOPHAGE) 1000 MG tablet TAKE 1 TABLET (1,000 MG TOTAL) BY MOUTH 2 (TWO) TIMES DAILY WITH A MEAL.  Marland Kitchen ONETOUCH VERIO test strip USE AS INSTRUCTED  . simvastatin (ZOCOR) 20 MG tablet TAKE 1 TABLET BY MOUTH EVERY DAY  . TRUEPLUS LANCETS 28G MISC 1 each by Does not apply route 3 (three) times daily.   No facility-administered encounter medications on file as of 11/18/2018.     Observations/Objective: No direct observation done as this was a telephone encounter  Assessment and Plan: 1. Diabetes mellitus type 2, uncontrolled, without complications ([place]) We discussed her concerns about metformin.  Informed that the recall was on the extended release not the regular metformin which she is currently on.  Advised that most of the other oral diabetic meds may not lower her A1c as well as metformin.  Currently diabetes is not at goal based on blood sugar readings so we are already looking at having to add another medication.  Patient decides to stay on the metformin.  We will add Januvia. Encouraged her to get in some exercise outside of work at least 3 to 4 days a week for 30 minutes.   Continue healthy eating habits. - Hemoglobin A1c; Future - metFORMIN (GLUCOPHAGE) 1000 MG tablet; Take 1 tablet (1,000 mg total) by mouth 2 (two) times daily with a meal.  Dispense: 60 tablet; Refill: 6 - sitaGLIPtin (JANUVIA) 50 MG tablet; Take 1 tablet (50 mg total) by mouth daily.  Dispense: 30 tablet; Refill: 5 - Ambulatory referral to Ophthalmology  2. Essential hypertension Encouraged to check blood pressure at least twice a week with goal being 130/80 or lower - lisinopril-hydrochlorothiazide (ZESTORETIC) 20-12.5 MG tablet; Take 1 tablet by mouth daily.  Dispense: 90 tablet; Refill: 1  3. Hyperlipidemia, unspecified hyperlipidemia type - simvastatin (ZOCOR) 20 MG tablet; Take 1 tablet (20 mg total) by mouth daily.  Dispense: 90 tablet; Refill: 1  4. Colon cancer screening She will come and pick up the fecal Hemoccult test.  If positive again she states she would look into having the colonoscopy done - Fecal occult blood, imunochemical([facility])  5. Breast cancer screening - MM Digital Screening; Future  6. Educated About Covid-19 Virus Infection Results of her COVID test is not back as yet. Discussed the importance of social distancing, wearing a mask in public and good handwashing to help prevent contracting this virus   Follow Up Instructions: Follow-up in 3 months  I discussed the assessment and treatment plan with the patient. The patient was provided an opportunity to ask questions and all were answered. The patient agreed with the plan and demonstrated an understanding of the instructions.   The patient was advised to call back or seek an in-person evaluation if the symptoms worsen or if the condition fails to improve as anticipated.  I provided 20 minutes of non-face-to-face time during this encounter.   Karle Plumber, MD

## 2018-11-21 LAB — NOVEL CORONAVIRUS, NAA: SARS-CoV-2, NAA: NOT DETECTED

## 2018-11-29 ENCOUNTER — Telehealth: Payer: Self-pay | Admitting: Internal Medicine

## 2018-11-29 NOTE — Telephone Encounter (Signed)
Please contact pt and make aware that she can get a copy of results when she comes in and she can get her blood work done also

## 2018-11-29 NOTE — Telephone Encounter (Signed)
Pt states needs copy of covid results to take with her traveling. Pt states will pick up her and her husbands test results today to take on their trip.  Also, pt wants to know if she should come in for her labs for A1C that doctor wanted her to have. Please call pt.

## 2018-12-01 ENCOUNTER — Other Ambulatory Visit: Payer: Medicare HMO

## 2018-12-01 NOTE — Telephone Encounter (Signed)
Pt coming tomorrow 12/02/18 for labs and will pick up copy of covid test results. Please have copy of covid test results ready for pick up.

## 2018-12-02 ENCOUNTER — Other Ambulatory Visit: Payer: Self-pay

## 2018-12-02 ENCOUNTER — Ambulatory Visit: Payer: Medicare HMO | Attending: Family Medicine

## 2018-12-02 DIAGNOSIS — E1165 Type 2 diabetes mellitus with hyperglycemia: Secondary | ICD-10-CM | POA: Diagnosis not present

## 2018-12-02 DIAGNOSIS — IMO0001 Reserved for inherently not codable concepts without codable children: Secondary | ICD-10-CM

## 2018-12-02 DIAGNOSIS — Z1211 Encounter for screening for malignant neoplasm of colon: Secondary | ICD-10-CM

## 2018-12-03 LAB — HEMOGLOBIN A1C
Est. average glucose Bld gHb Est-mCnc: 203 mg/dL
Hgb A1c MFr Bld: 8.7 % — ABNORMAL HIGH (ref 4.8–5.6)

## 2018-12-05 ENCOUNTER — Telehealth: Payer: Self-pay | Admitting: *Deleted

## 2018-12-05 NOTE — Telephone Encounter (Signed)
-----   Message from Ladell Pier, MD sent at 12/03/2018  7:23 PM EDT ----- Let pt know that her A1C was 8.7 with goal being less than 7.  We added Januvia on last visit. Continue current meds, healthy eating habits and regular exercise as discussed on recent tele visit.

## 2018-12-05 NOTE — Telephone Encounter (Signed)
Medical Assistant used [employer] to contact patient.  Interpreter Name: Cathren Harsh #: [id number] Patient was not available, [employer] left patient a voicemail. Voicemail states to give a call back to [place] with [facility] at [phone].

## 2018-12-07 DIAGNOSIS — Z1211 Encounter for screening for malignant neoplasm of colon: Secondary | ICD-10-CM | POA: Diagnosis not present

## 2018-12-13 ENCOUNTER — Telehealth: Payer: Self-pay | Admitting: Internal Medicine

## 2018-12-13 ENCOUNTER — Other Ambulatory Visit: Payer: Self-pay | Admitting: Internal Medicine

## 2018-12-13 DIAGNOSIS — IMO0001 Reserved for inherently not codable concepts without codable children: Secondary | ICD-10-CM

## 2018-12-13 NOTE — Telephone Encounter (Signed)
Patient called for their results states they do not need and interpreter. Please follow up.

## 2018-12-15 NOTE — Telephone Encounter (Signed)
Left message on voicemail to return call.

## 2018-12-15 NOTE — Telephone Encounter (Signed)
Pt called back for test results. Please return call.

## 2018-12-16 LAB — FECAL OCCULT BLOOD, IMMUNOCHEMICAL: Fecal Occult Bld: NEGATIVE

## 2018-12-16 NOTE — Telephone Encounter (Signed)
Returned pt call.   Dr. Wynetta Emery pt states she has not been taking the Januvia. Pt states that the medicine gives her heartburn and upsets her stomach.

## 2018-12-17 MED ORDER — FARXIGA 5 MG PO TABS: 5.0000 mg | ORAL_TABLET | Freq: Every day | ORAL | 5 refills | Status: DC

## 2018-12-19 NOTE — Telephone Encounter (Signed)
Contacted pt and left a detailed vm informing pt of fit test results and Dr. Wynetta Emery message and if she has any questions or concerns to give me a call   If pt calls back please go over Dr. Wynetta Emery message and Fit Test results  FIT is negative. Needs repeat in 1 yr.

## 2018-12-23 DIAGNOSIS — Z7984 Long term (current) use of oral hypoglycemic drugs: Secondary | ICD-10-CM | POA: Diagnosis not present

## 2018-12-23 DIAGNOSIS — H5203 Hypermetropia, bilateral: Secondary | ICD-10-CM | POA: Diagnosis not present

## 2018-12-23 DIAGNOSIS — H25013 Cortical age-related cataract, bilateral: Secondary | ICD-10-CM | POA: Diagnosis not present

## 2018-12-23 DIAGNOSIS — H524 Presbyopia: Secondary | ICD-10-CM | POA: Diagnosis not present

## 2018-12-23 DIAGNOSIS — H2513 Age-related nuclear cataract, bilateral: Secondary | ICD-10-CM | POA: Diagnosis not present

## 2018-12-23 DIAGNOSIS — E119 Type 2 diabetes mellitus without complications: Secondary | ICD-10-CM | POA: Diagnosis not present

## 2019-01-20 ENCOUNTER — Ambulatory Visit: Payer: Medicare HMO

## 2019-02-13 ENCOUNTER — Other Ambulatory Visit: Payer: Self-pay | Admitting: Internal Medicine

## 2019-02-13 DIAGNOSIS — E119 Type 2 diabetes mellitus without complications: Secondary | ICD-10-CM

## 2019-02-13 DIAGNOSIS — E1165 Type 2 diabetes mellitus with hyperglycemia: Secondary | ICD-10-CM

## 2019-02-13 DIAGNOSIS — R69 Illness, unspecified: Secondary | ICD-10-CM | POA: Diagnosis not present

## 2019-05-13 ENCOUNTER — Other Ambulatory Visit: Payer: Self-pay | Admitting: Internal Medicine

## 2019-05-13 DIAGNOSIS — I10 Essential (primary) hypertension: Secondary | ICD-10-CM

## 2019-05-21 ENCOUNTER — Other Ambulatory Visit: Payer: Self-pay | Admitting: Internal Medicine

## 2019-05-21 DIAGNOSIS — E1165 Type 2 diabetes mellitus with hyperglycemia: Secondary | ICD-10-CM

## 2019-05-22 ENCOUNTER — Other Ambulatory Visit: Payer: Self-pay | Admitting: Internal Medicine

## 2019-05-22 DIAGNOSIS — E1165 Type 2 diabetes mellitus with hyperglycemia: Secondary | ICD-10-CM

## 2019-05-22 DIAGNOSIS — E785 Hyperlipidemia, unspecified: Secondary | ICD-10-CM

## 2019-06-09 ENCOUNTER — Ambulatory Visit: Payer: Medicare HMO | Attending: Internal Medicine | Admitting: Internal Medicine

## 2019-06-09 DIAGNOSIS — E119 Type 2 diabetes mellitus without complications: Secondary | ICD-10-CM | POA: Diagnosis not present

## 2019-06-09 DIAGNOSIS — E785 Hyperlipidemia, unspecified: Secondary | ICD-10-CM | POA: Diagnosis not present

## 2019-06-09 DIAGNOSIS — Z91199 Patient's noncompliance with other medical treatment and regimen due to unspecified reason: Secondary | ICD-10-CM

## 2019-06-09 DIAGNOSIS — Z5329 Procedure and treatment not carried out because of patient's decision for other reasons: Secondary | ICD-10-CM

## 2019-06-10 LAB — LIPID PANEL
Chol/HDL Ratio: 2 ratio (ref 0.0–4.4)
Cholesterol, Total: 151 mg/dL (ref 100–199)
HDL: 74 mg/dL (ref >39)
LDL Chol Calc (NIH): 52 mg/dL (ref 0–99)
Triglycerides: 151 mg/dL — ABNORMAL HIGH (ref 0–149)
VLDL Cholesterol Cal: 25 mg/dL (ref 5–40)

## 2019-06-10 LAB — CBC
Hematocrit: 40.8 % (ref 34.0–46.6)
Hemoglobin: 13.1 g/dL (ref 11.1–15.9)
MCH: 27.2 pg (ref 26.6–33.0)
MCHC: 32.1 g/dL (ref 31.5–35.7)
MCV: 85 fL (ref 79–97)
Platelets: 332 10*3/uL (ref 150–450)
RBC: 4.82 x10E6/uL (ref 3.77–5.28)
RDW: 14.1 % (ref 11.7–15.4)
WBC: 8.5 10*3/uL (ref 3.4–10.8)

## 2019-06-10 LAB — COMPREHENSIVE METABOLIC PANEL
ALT: 15 IU/L (ref 0–32)
AST: 21 IU/L (ref 0–40)
Albumin/Globulin Ratio: 1.5 (ref 1.2–2.2)
Albumin: 4.3 g/dL (ref 3.8–4.8)
Alkaline Phosphatase: 77 IU/L (ref 39–117)
BUN/Creatinine Ratio: 8 — ABNORMAL LOW (ref 12–28)
BUN: 5 mg/dL — ABNORMAL LOW (ref 8–27)
Bilirubin Total: 0.4 mg/dL (ref 0.0–1.2)
CO2: 25 mmol/L (ref 20–29)
Calcium: 9.9 mg/dL (ref 8.7–10.3)
Chloride: 98 mmol/L (ref 96–106)
Creatinine, Ser: 0.66 mg/dL (ref 0.57–1.00)
GFR calc Af Amer: 106 mL/min/{1.73_m2} (ref >59)
GFR calc non Af Amer: 92 mL/min/{1.73_m2} (ref >59)
Globulin, Total: 2.8 g/dL (ref 1.5–4.5)
Glucose: 188 mg/dL — ABNORMAL HIGH (ref 65–99)
Potassium: 4.1 mmol/L (ref 3.5–5.2)
Sodium: 139 mmol/L (ref 134–144)
Total Protein: 7.1 g/dL (ref 6.0–8.5)

## 2019-06-10 LAB — MICROALBUMIN / CREATININE URINE RATIO
Creatinine, Urine: 168.4 mg/dL
Microalb/Creat Ratio: 8 mg/g creat (ref 0–29)
Microalbumin, Urine: 13.5 ug/mL

## 2019-06-10 LAB — HEMOGLOBIN A1C
Est. average glucose Bld gHb Est-mCnc: 212 mg/dL
Hgb A1c MFr Bld: 9 % — ABNORMAL HIGH (ref 4.8–5.6)

## 2019-06-11 ENCOUNTER — Encounter: Payer: Self-pay | Admitting: Internal Medicine

## 2019-06-11 NOTE — Progress Notes (Addendum)
Pt no showed this telephone visit on 06/09/2019 but showed up to lab wanting lab tests done.  She rescheduled appt with me for 06/2019. CMA ordered base line labs

## 2019-06-13 ENCOUNTER — Telehealth: Payer: Self-pay

## 2019-06-13 NOTE — Telephone Encounter (Signed)
Contacted pt to go over lab results pt states she hasn't been taking the Fargixa 5MG . Pt states the medications makes her dizzy  Pt doesn't have any questions or concerns regarding labs

## 2019-06-14 ENCOUNTER — Other Ambulatory Visit: Payer: Self-pay | Admitting: Internal Medicine

## 2019-06-14 DIAGNOSIS — E1165 Type 2 diabetes mellitus with hyperglycemia: Secondary | ICD-10-CM

## 2019-06-14 DIAGNOSIS — E785 Hyperlipidemia, unspecified: Secondary | ICD-10-CM

## 2019-06-16 NOTE — Telephone Encounter (Signed)
Tried contacting pt was unable to reach pt due to busy signal

## 2019-06-30 ENCOUNTER — Encounter: Payer: Self-pay | Admitting: Internal Medicine

## 2019-06-30 ENCOUNTER — Ambulatory Visit: Payer: Medicare HMO | Attending: Internal Medicine | Admitting: Internal Medicine

## 2019-06-30 ENCOUNTER — Other Ambulatory Visit: Payer: Self-pay | Admitting: Internal Medicine

## 2019-06-30 VITALS — Ht 63.0 in

## 2019-06-30 DIAGNOSIS — Z2821 Immunization not carried out because of patient refusal: Secondary | ICD-10-CM | POA: Diagnosis not present

## 2019-06-30 DIAGNOSIS — I10 Essential (primary) hypertension: Secondary | ICD-10-CM

## 2019-06-30 DIAGNOSIS — E1169 Type 2 diabetes mellitus with other specified complication: Secondary | ICD-10-CM | POA: Diagnosis not present

## 2019-06-30 DIAGNOSIS — E119 Type 2 diabetes mellitus without complications: Secondary | ICD-10-CM

## 2019-06-30 DIAGNOSIS — E785 Hyperlipidemia, unspecified: Secondary | ICD-10-CM | POA: Diagnosis not present

## 2019-06-30 MED ORDER — AMLODIPINE BESYLATE 5 MG PO TABS: 5.0000 mg | ORAL_TABLET | Freq: Every day | ORAL | 5 refills | Status: DC

## 2019-06-30 MED ORDER — CANAGLIFLOZIN 100 MG PO TABS: 100.0000 mg | ORAL_TABLET | Freq: Every day | ORAL | 3 refills | Status: DC

## 2019-06-30 NOTE — Progress Notes (Addendum)
Virtual Visit via Telephone Note Due to current restrictions/limitations of in-office visits due to the COVID-19 pandemic, this scheduled clinical appointment was converted to a telehealth visit  I connected with Michele [name] on 06/30/19 at 2:53 p.m by telephone and verified that I am speaking with the correct person using two identifiers. I am in my office.  The patient is at home.  Only the patient and myself participated in this encounter.  I discussed the limitations, risks, security and privacy concerns of performing an evaluation and management service by telephone and the availability of in person appointments. I also discussed with the patient that there may be a patient responsible charge related to this service. The patient expressed understanding and agreed to proceed.   History of Present Illness: Patient with history of diabetes type 2, HTN, HL, vaginal prolapse.  Last eval 11/2018.  Purpose of today's visit is chronic disease management.  DIABETES TYPE 2 Last A1C:   Lab Results  Component Value Date   HGBA1C 9.0 (H) 06/09/2019    Med Adherence:  [] Yes    [x] No.  Farxiga added on last visit.  She stopped it because it caused dizziness for her.  Reports compliance with Metformin and Glucotrol Medication side effects:  [] Yes    [] No Home Monitoring?  [x] Yes -twice a day Home glucose results range: after meals and at bedtime in 200; mornings 100-125 Diet Adherence: [x] Yes  -stopped eating so much white carbs.  Changed to low fat milk; stopped drinking sodas and juices Exercise: [] Yes    [x] No - "not as much as I want because of my work"  And has no sidewalks in her neighborhood. Top dark when she gets off work Hypoglycemic episodes?: [] Yes    [x] No Numbness of the feet? [] Yes    [x] No Retinopathy hx? [] Yes    [] No Last eye exam:  Comments:   HYPERTENSION Currently taking: see medication list Med Adherence: [x] Yes    [] No Medication side effects: [] Yes     [x] No Adherence with salt restriction: [x] Yes    [] No Home Monitoring?: [x] Yes    [] No Monitoring Frequency: once a wk.  140-150 for SBP.  Does not recall her DBP Home BP results range: [] Yes    [] No SOB? [] Yes    [x] No Chest Pain?: [x] Yes -some discomfort on RT side of chest for 2 days earlier this mth.  No relation to activity.  No radiation.  Felt it was gas.  No N/V. Leg swelling?: [] Yes    [x] No Headaches?: [] Yes    [x] No Dizziness? [] Yes    [x] No Comments:   HL:  Recent lipid profile at goal.  Compliant with Zocor  HM:  Due for DEXA.  She declines Dexa.  She does not plan to get COVID vaccine  Outpatient Encounter Medications as of 06/30/2019  Medication Sig  . Blood Glucose Monitoring Suppl (TRUE METRIX METER) w/Device KIT 1 each by Does not apply route 3 (three) times daily.  . dapagliflozin propanediol (FARXIGA) 5 MG TABS tablet Take 5 mg by mouth daily.  Marland Kitchen estradiol (ESTRACE) 0.1 MG/GM vaginal cream Apply 1 gram per vagina every night for 2 weeks, then apply three times a week  . glipiZIDE (GLUCOTROL) 10 MG tablet Take 1 tablet (10 mg total) by mouth 2 (two) times daily. Please make PCP  appointment.  Marland Kitchen lisinopril-hydrochlorothiazide (ZESTORETIC) 20-12.5 MG tablet TAKE 1 TABLET BY MOUTH EVERY DAY  . metFORMIN (GLUCOPHAGE) 1000 MG tablet TAKE 1 TABLET (1,000 MG TOTAL) BY MOUTH 2 (TWO) TIMES DAILY WITH A MEAL.  Marland Kitchen ONETOUCH VERIO test strip USE AS INSTRUCTED  . simvastatin (ZOCOR) 20 MG tablet TAKE 1 TABLET BY MOUTH EVERY DAY  . TRUEPLUS LANCETS 28G MISC 1 each by Does not apply route 3 (three) times daily.   No facility-administered encounter medications on file as of 06/30/2019.      Observations/Objective: Results for orders placed or performed in visit on 06/09/19  Hemoglobin A1c  Result Value Ref Range   Hgb A1c MFr Bld 9.0 (H) 4.8 - 5.6 %   Est. average glucose Bld gHb Est-mCnc 212 mg/dL  Microalbumin / creatinine urine ratio  Result Value Ref Range    Creatinine, Urine 168.4 Not Estab. mg/dL   Microalbumin, Urine 13.5 Not Estab. ug/mL   Microalb/Creat Ratio 8 0 - 29 mg/g creat  Lipid panel  Result Value Ref Range   Cholesterol, Total 151 100 - 199 mg/dL   Triglycerides 151 (H) 0 - 149 mg/dL   HDL 74 >39 mg/dL   VLDL Cholesterol Cal 25 5 - 40 mg/dL   LDL Chol Calc (NIH) 52 0 - 99 mg/dL   Chol/HDL Ratio 2.0 0.0 - 4.4 ratio  CBC  Result Value Ref Range   WBC 8.5 3.4 - 10.8 x10E3/uL   RBC 4.82 3.77 - 5.28 x10E6/uL   Hemoglobin 13.1 11.1 - 15.9 g/dL   Hematocrit 40.8 34.0 - 46.6 %   MCV 85 79 - 97 fL   MCH 27.2 26.6 - 33.0 pg   MCHC 32.1 31.5 - 35.7 g/dL   RDW 14.1 11.7 - 15.4 %   Platelets 332 150 - 450 x10E3/uL  Comprehensive metabolic panel  Result Value Ref Range   Glucose 188 (H) 65 - 99 mg/dL   BUN 5 (L) 8 - 27 mg/dL   Creatinine, Ser 0.66 0.57 - 1.00 mg/dL   GFR calc non Af Amer 92 >59 mL/min/1.73   GFR calc Af Amer 106 >59 mL/min/1.73   BUN/Creatinine Ratio 8 (L) 12 - 28   Sodium 139 134 - 144 mmol/L   Potassium 4.1 3.5 - 5.2 mmol/L   Chloride 98 96 - 106 mmol/L   CO2 25 20 - 29 mmol/L   Calcium 9.9 8.7 - 10.3 mg/dL   Total Protein 7.1 6.0 - 8.5 g/dL   Albumin 4.3 3.8 - 4.8 g/dL   Globulin, Total 2.8 1.5 - 4.5 g/dL   Albumin/Globulin Ratio 1.5 1.2 - 2.2   Bilirubin Total 0.4 0.0 - 1.2 mg/dL   Alkaline Phosphatase 77 39 - 117 IU/L   AST 21 0 - 40 IU/L   ALT 15 0 - 32 IU/L     Assessment and Plan: 1. Type 2 diabetes mellitus without complication, without long-term current use of insulin ([place]) Blood sugars not at goal.  She did not tolerate [place].  She did not tolerate Januvia.  She is not interested in trying any injectables.  I recommend giving a try of the [place] and she is willing to do so provided her co-pay is not too high. Commended her on dietary changes. Encouraged her to get in some exercise at least 3 to 4 days a week for 30 minutes. - canagliflozin (INVOKANA) 100 MG TABS tablet; Take 1 tablet (100  mg total) by mouth daily before breakfast.  Dispense: 30  tablet; Refill: 3  2. Essential hypertension Not at goal.  Continue lisinopril/hydrochlorothiazide.  Add low-dose amlodipine.  Advised patient that blood pressure goal is 130/80 or lower. - amLODipine (NORVASC) 5 MG tablet; Take 1 tablet (5 mg total) by mouth daily.  Dispense: 30 tablet; Refill: 5  3. Hyperlipidemia associated with type 2 diabetes mellitus (HCC) Continue Zocor  4. COVID-19 virus vaccination declined Discussed COVID-19 vaccination and I recommend that she gets it due to her chronic medical conditions.  However patient states that she has made up in her mind that she is not going to get it.   Follow Up Instructions: 4 mths   I discussed the assessment and treatment plan with the patient. The patient was provided an opportunity to ask questions and all were answered. The patient agreed with the plan and demonstrated an understanding of the instructions.   The patient was advised to call back or seek an in-person evaluation if the symptoms worsen or if the condition fails to improve as anticipated.  I provided 19 minutes of non-face-to-face time during this encounter.  ADDENDUM:  07/02/2019 - received message from pt's [facility] that [employer] not covered.  Vania Rea is one of the prefer meds for insurance coverage.  Will change to Jardiance instead.  Karle Plumber, MD

## 2019-06-30 NOTE — Progress Notes (Signed)
Pt states her blood sugar this morning was 145

## 2019-07-02 DIAGNOSIS — E1169 Type 2 diabetes mellitus with other specified complication: Secondary | ICD-10-CM | POA: Insufficient documentation

## 2019-07-02 MED ORDER — JARDIANCE 10 MG PO TABS: 10.0000 mg | ORAL_TABLET | Freq: Every day | ORAL | 5 refills | Status: DC

## 2019-07-02 NOTE — Addendum Note (Signed)
Addended by: Jonah Blue B on: 07/02/2019 08:14 AM   Modules accepted: Orders

## 2019-07-19 ENCOUNTER — Other Ambulatory Visit: Payer: Self-pay | Admitting: Internal Medicine

## 2019-07-19 DIAGNOSIS — E1165 Type 2 diabetes mellitus with hyperglycemia: Secondary | ICD-10-CM

## 2019-07-19 DIAGNOSIS — E785 Hyperlipidemia, unspecified: Secondary | ICD-10-CM

## 2019-08-13 ENCOUNTER — Other Ambulatory Visit: Payer: Self-pay | Admitting: Internal Medicine

## 2019-08-13 ENCOUNTER — Other Ambulatory Visit: Payer: Self-pay

## 2019-08-13 ENCOUNTER — Ambulatory Visit (HOSPITAL_COMMUNITY)
Admission: EM | Admit: 2019-08-13 | Discharge: 2019-08-13 | Disposition: A | Discharge status: Home / Self Care | Payer: Medicare HMO

## 2019-08-13 DIAGNOSIS — M545 Low back pain, unspecified: Secondary | ICD-10-CM

## 2019-08-13 DIAGNOSIS — I10 Essential (primary) hypertension: Secondary | ICD-10-CM

## 2019-08-13 DIAGNOSIS — E1165 Type 2 diabetes mellitus with hyperglycemia: Secondary | ICD-10-CM

## 2019-08-13 MED ORDER — DICLOFENAC SODIUM 50 MG PO TBEC: 50.0000 mg | DELAYED_RELEASE_TABLET | Freq: Two times a day (BID) | ORAL | 0 refills | Status: DC

## 2019-08-13 MED ORDER — TIZANIDINE HCL 4 MG PO TABS: 4.0000 mg | ORAL_TABLET | Freq: Four times a day (QID) | ORAL | 0 refills | Status: DC | PRN

## 2019-08-13 NOTE — ED Provider Notes (Signed)
[place]    CSN: [id number] Arrival date & time: 08/13/19  1147      History   Chief Complaint Chief Complaint  Patient presents with  . Back Pain    HPI Michele [name] is a 69 y.o. female history of hypertension, DM type II, presenting today for evaluation of back pain.  Patient notes that she has had left lower back pain for approximately 1 week.  Denies any injury fall or trauma.  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  Pain worsens with twisting and certain movements.  Denies any radiation into legs.  Denies numbness or tingling.  Denies any urinary symptoms of dysuria, increased frequency, hematuria.  Denies nausea or vomiting.  Has been using Tylenol and ibuprofen without relief.  Reports blood sugars typically around 150 in the morning.  HPI  Past Medical History:  Diagnosis Date  . Diabetes mellitus without complication ([place])   . Hypertension     Patient Active Problem List   Diagnosis Date Noted  . Hyperlipidemia associated with type 2 diabetes mellitus ([place]) 07/02/2019  . COVID-19 virus vaccination declined 06/30/2019  . Complete uterine prolapse 10/27/2016  . Controlled type 2 diabetes mellitus without complication, without long-term current use of insulin ([place]) 09/28/2016  . Essential hypertension 09/28/2016  . Hyperlipidemia 09/28/2016  . Vaginal prolapse 09/28/2016    No past surgical history on file.  OB History    Gravida  4   Para  4   Term  4   Preterm      AB      Living        SAB      TAB      Ectopic      Multiple      Live Births  4            Home Medications    Prior to Admission medications   Medication Sig Start Date End Date Taking? Authorizing Provider  glipiZIDE (GLUCOTROL) 10 MG tablet Take 1 tablet (10 mg total) by mouth 2 (two) times daily. Please make PCP appointment. 05/22/19  Yes Ladell Pier, MD   glipiZIDE (GLUCOTROL) 5 MG tablet  06/17/17  Yes [provider]  lisinopril-hydrochlorothiazide (ZESTORETIC) 20-12.5 MG tablet TAKE 1 TABLET BY MOUTH EVERY DAY 05/16/19  Yes Ladell Pier, MD  lisinopril-hydrochlorothiazide (ZESTORETIC) 20-12.5 MG tablet TK 1 T PO QD. MUST HAVE OFFICE VISIT FOR FURTHER REFILLS 06/03/17  Yes [provider]  metFORMIN (GLUCOPHAGE) 1000 MG tablet TAKE 1 TABLET (1,000 MG TOTAL) BY MOUTH 2 (TWO) TIMES DAILY WITH A MEAL. 07/19/19  Yes Ladell Pier, MD  simvastatin (ZOCOR) 20 MG tablet TAKE 1 TABLET BY MOUTH EVERY DAY 07/19/19  Yes Ladell Pier, MD  Blood Glucose Monitoring Suppl (TRUE METRIX METER) w/Device KIT 1 each by Does not apply route 3 (three) times daily. 09/30/16   Ladell Pier, MD  diclofenac (VOLTAREN) 50 MG EC tablet Take 1 tablet (50 mg total) by mouth 2 (two) times daily. 08/13/19   Ludger Bones C, PA-C  estradiol (ESTRACE) 0.1 MG/GM vaginal cream Apply 1 gram per vagina every night for 2 weeks, then apply three times a week 01/08/17   Emily Filbert, MD  [facility] VERIO test strip USE AS INSTRUCTED 02/13/19   Ladell Pier, MD  tiZANidine (ZANAFLEX) 4 MG tablet Take 1 tablet (4 mg  total) by mouth every 6 (six) hours as needed for muscle spasms. 08/13/19   Alexcia Schools C, PA-C  TRUEPLUS LANCETS 28G MISC 1 each by Does not apply route 3 (three) times daily. 09/30/16   Ladell Pier, MD  amLODipine (NORVASC) 5 MG tablet Take 1 tablet (5 mg total) by mouth daily. 06/30/19 08/13/19  Ladell Pier, MD    Family History Family History  Problem Relation Age of Onset  . Diabetes Mother   . Hypertension Mother   . Diabetes Father   . Hypertension Father   . Diabetes Brother   . Hypertension Brother     Social History Social History   Tobacco Use  . Smoking status: Never Smoker  . Smokeless tobacco: Never Used  Substance Use Topics  . Alcohol use: No  . Drug use: No     Allergies   Farxiga  [dapagliflozin] and Januvia [sitagliptin]   Review of Systems Review of Systems  Constitutional: Negative for fatigue and fever.  Eyes: Negative for visual disturbance.  Respiratory: Negative for shortness of breath.   Cardiovascular: Negative for chest pain.  Gastrointestinal: Negative for abdominal pain, nausea and vomiting.  Genitourinary: Negative for decreased urine volume and difficulty urinating.  Musculoskeletal: Positive for back pain and myalgias. Negative for arthralgias and joint swelling.  Skin: Negative for color change, rash and wound.  Neurological: Negative for dizziness, weakness, light-headedness and headaches.     Physical Exam Triage Vital Signs ED Triage Vitals  Enc Vitals Group     BP 08/13/19 1221 (!) 145/84     Pulse Rate 08/13/19 1221 77     Resp 08/13/19 1221 18     Temp 08/13/19 1221 98.4 F (36.9 C)     Temp Source 08/13/19 1221 Oral     SpO2 08/13/19 1221 100 %     Weight --      Height --      Head Circumference --      Peak Flow --      Pain Score 08/13/19 1220 6     Pain Loc --      Pain Edu? --      Excl. in [place]? --    No data found.  Updated Vital Signs BP (!) 145/84 (BP Location: Left Arm)   Pulse 77   Temp 98.4 F (36.9 C) (Oral)   Resp 18   SpO2 100%   Visual Acuity Right Eye Distance:   Left Eye Distance:   Bilateral Distance:    Right Eye Near:   Left Eye Near:    Bilateral Near:     Physical Exam Vitals and nursing note reviewed.  Constitutional:      Appearance: She is well-developed.     Comments: No acute distress  HENT:     Head: Normocephalic and atraumatic.     Nose: Nose normal.  Eyes:     Conjunctiva/sclera: Conjunctivae normal.  Cardiovascular:     Rate and Rhythm: Normal rate.  Pulmonary:     Effort: Pulmonary effort is normal. No respiratory distress.  Abdominal:     General: There is no distension.  Musculoskeletal:        General: Normal range of motion.     Cervical back: Neck supple.      Comments: Back: Nontender to palpation to thoracic, lumbar spine midline, increased tenderness to left lower lumbar/superior sacral paraspinal musculature  Strength at hips 5/5 ankle bilaterally, knee strength 5/5 and equal bilaterally, patellar reflex 2+  bilaterally  Skin:    General: Skin is warm and dry.  Neurological:     Mental Status: She is alert and oriented to person, place, and time.      UC Treatments / Results  Labs (all labs ordered are listed, but only abnormal results are displayed) Labs Reviewed - No data to display  EKG   Radiology No results found.  Procedures Procedures (including critical care time)  Medications Ordered in UC Medications - No data to display  Initial Impression / Assessment and Plan / UC Course  I have reviewed the triage vital signs and the nursing notes.  Pertinent labs & imaging results that were available during my care of the patient were reviewed by me and considered in my medical decision making (see chart for details).     Suspect likely lumbar strain, muscular etiology.  Do not suspect acute bony abnormality.  History of diabetes, deferring steroids at this time, no radicular symptoms.  We will proceed with alternative NSAID and add in muscle relaxer.  Discussed activity modification.  Continue to monitor,Discussed strict return precautions. Patient verbalized understanding and is agreeable with plan.  Discussed strict return precautions. Patient verbalized understanding and is agreeable with plan.  Final Clinical Impressions(s) / UC Diagnoses   Final diagnoses:  Acute left-sided low back pain without sciatica     Discharge Instructions     Please use diclofenac twice daily with food May use tizanidine as needed, may cause some slight drowsiness, do not drive or work after taking Alternate ice and heat to back Avoid complete bedrest, avoid heavy lifting Gentle stretching-see attached  Please follow-up if symptoms not  improving or worsening, developing any urinary symptoms, leg weakness or numbness or tingling   ED Prescriptions    Medication Sig Dispense Auth. Provider   tiZANidine (ZANAFLEX) 4 MG tablet Take 1 tablet (4 mg total) by mouth every 6 (six) hours as needed for muscle spasms. 30 tablet Ziaire Hagos C, PA-C   diclofenac (VOLTAREN) 50 MG EC tablet Take 1 tablet (50 mg total) by mouth 2 (two) times daily. 30 tablet Daven Pinckney, [place] C, PA-C     PDMP not reviewed this encounter.   Janith Lima, PA-C 08/13/19 1250

## 2019-08-13 NOTE — Discharge Instructions (Signed)
Please use diclofenac twice daily with food May use tizanidine as needed, may cause some slight drowsiness, do not drive or work after taking Alternate ice and heat to back Avoid complete bedrest, avoid heavy lifting Gentle stretching-see attached  Please follow-up if symptoms not improving or worsening, developing any urinary symptoms, leg weakness or numbness or tingling

## 2019-08-13 NOTE — ED Triage Notes (Signed)
Pt c/o lower left lumbar back pain for approx 1 week. 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 Pt states pain worsens when rising from a chair. Cannot attribute pain to specific injury/trauma.  Denies numbness/tingling to feet/legs.  Denies abdom pain, dysuria sx, fever, chills, n/v/d.

## 2019-10-18 ENCOUNTER — Other Ambulatory Visit: Payer: Self-pay | Admitting: Internal Medicine

## 2019-10-18 DIAGNOSIS — E1165 Type 2 diabetes mellitus with hyperglycemia: Secondary | ICD-10-CM

## 2019-10-18 DIAGNOSIS — E785 Hyperlipidemia, unspecified: Secondary | ICD-10-CM

## 2019-11-08 ENCOUNTER — Other Ambulatory Visit: Payer: Self-pay | Admitting: Internal Medicine

## 2019-11-08 DIAGNOSIS — E119 Type 2 diabetes mellitus without complications: Secondary | ICD-10-CM

## 2019-11-08 DIAGNOSIS — I10 Essential (primary) hypertension: Secondary | ICD-10-CM

## 2019-11-08 DIAGNOSIS — R69 Illness, unspecified: Secondary | ICD-10-CM | POA: Diagnosis not present

## 2019-11-08 DIAGNOSIS — E1165 Type 2 diabetes mellitus with hyperglycemia: Secondary | ICD-10-CM

## 2019-11-08 MED ORDER — ONETOUCH VERIO VI STRP: ORAL_STRIP | 0 refills | Status: DC

## 2019-11-08 NOTE — Telephone Encounter (Signed)
Requested medication (s) are due for refill today: yes  Requested medication (s) are on the active medication list:yes  Last refill:  08/13/19  90 days  Future visit scheduled:No  Notes to clinic: Note with last refill states patient needs OV  Attempted to contact patient. Left VM to return call to office to schedule.    Requested Prescriptions  Pending Prescriptions Disp Refills   glipiZIDE (GLUCOTROL) 10 MG tablet [Pharmacy Med Name: GLIPIZIDE 10 MG TABLET] 180 tablet 0    Sig: TAKE 1 TABLET (10 MG TOTAL) BY MOUTH 2 (TWO) TIMES DAILY. PLEASE MAKE PCP APPOINTMENT.      Endocrinology:  Diabetes - Sulfonylureas Failed - 11/08/2019  1:21 AM      Failed - HBA1C is between 0 and 7.9 and within 180 days    HbA1c, POC (controlled diabetic range)  Date Value Ref Range Status  02/25/2018 9.3 (A) 0.0 - 7.0 % Final   Hgb A1c MFr Bld  Date Value Ref Range Status  06/09/2019 9.0 (H) 4.8 - 5.6 % Final    Comment:             Prediabetes: 5.7 - 6.4          Diabetes: >6.4          Glycemic control for adults with diabetes: <7.0           Passed - Valid encounter within last 6 months    Recent Outpatient Visits           4 months ago Type 2 diabetes mellitus without complication, without long-term current use of insulin (HCC)   [facility] Jonah Blue B, MD   5 months ago Hyperlipidemia, unspecified hyperlipidemia type   [facility] Jonah Blue B, MD   11 months ago Diabetes mellitus type 2, uncontrolled, without complications [facility])   [facility] Marcine Matar, MD   1 year ago Diabetes mellitus type 2, uncontrolled, without complications [facility])   [facility] Jonah Blue B, MD   2 years ago Uncontrolled type 2 diabetes mellitus without complication, without long-term current use of insulin (HCC)   [place] [facility] Marcine Matar, MD                lisinopril-hydrochlorothiazide (ZESTORETIC) 20-12.5 MG tablet [Pharmacy Med Name: LISINOPRIL-HCTZ 20-12.5 MG TAB] 90 tablet 0    Sig: TAKE 1 TABLET BY MOUTH EVERY DAY . NEED OFFICE VISIT      Cardiovascular:  ACEI + Diuretic Combos Failed - 11/08/2019  1:21 AM      Failed - Last BP in normal range    BP Readings from Last 1 Encounters:  08/13/19 (!) 145/84          Passed - Na in normal range and within 180 days    Sodium  Date Value Ref Range Status  06/09/2019 139 134 - 144 mmol/L Final          Passed - K in normal range and within 180 days    Potassium  Date Value Ref Range Status  06/09/2019 4.1 3.5 - 5.2 mmol/L Final          Passed - Cr in normal range and within 180 days    Creatinine, Ser  Date Value Ref Range Status  06/09/2019 0.66 0.57 - 1.00 mg/dL Final  Passed - Ca in normal range and within 180 days    Calcium  Date Value Ref Range Status  06/09/2019 9.9 8.7 - 10.3 mg/dL Final          Passed - Patient is not pregnant      Passed - Valid encounter within last 6 months    Recent Outpatient Visits           4 months ago Type 2 diabetes mellitus without complication, without long-term current use of insulin (HCC)   [facility] Jonah Blue B, MD   5 months ago Hyperlipidemia, unspecified hyperlipidemia type   [facility] Jonah Blue B, MD   11 months ago Diabetes mellitus type 2, uncontrolled, without complications [facility])   [facility] Marcine Matar, MD   1 year ago Diabetes mellitus type 2, uncontrolled, without complications [facility])   [place] [facility] Jonah Blue B, MD   2 years ago Uncontrolled type 2 diabetes mellitus without complication, without long-term current use of insulin [facility])   [place] [facility] Marcine Matar, MD

## 2019-11-08 NOTE — Telephone Encounter (Signed)
Pt need a refill on true metrix test strip. [facility] [address] in [place]. Pt has made an appt for 12-08-2019

## 2019-11-08 NOTE — Telephone Encounter (Signed)
Requested Prescriptions  Pending Prescriptions Disp Refills  . glucose blood (ONETOUCH VERIO) test strip 100 strip 0    Sig: USE AS INSTRUCTED     Endocrinology: Diabetes - Testing Supplies Passed - 11/08/2019 10:44 AM      Passed - Valid encounter within last 12 months    Recent Outpatient Visits          4 months ago Type 2 diabetes mellitus without complication, without long-term current use of insulin (HCC)   [place] [facility] Jonah Blue B, MD   5 months ago Hyperlipidemia, unspecified hyperlipidemia type   [facility] Jonah Blue B, MD   11 months ago Diabetes mellitus type 2, uncontrolled, without complications [facility])   [place] [facility] Marcine Matar, MD   1 year ago Diabetes mellitus type 2, uncontrolled, without complications [facility])   [place] [facility] Jonah Blue B, MD   2 years ago Uncontrolled type 2 diabetes mellitus without complication, without long-term current use of insulin [facility])   [place] [facility] Marcine Matar, MD      Future Appointments            In 1 month Laural Benes, Binnie Rail, MD [facility]

## 2019-11-18 DIAGNOSIS — Z833 Family history of diabetes mellitus: Secondary | ICD-10-CM | POA: Diagnosis not present

## 2019-11-18 DIAGNOSIS — Z809 Family history of malignant neoplasm, unspecified: Secondary | ICD-10-CM | POA: Diagnosis not present

## 2019-11-18 DIAGNOSIS — I10 Essential (primary) hypertension: Secondary | ICD-10-CM | POA: Diagnosis not present

## 2019-11-18 DIAGNOSIS — Z7982 Long term (current) use of aspirin: Secondary | ICD-10-CM | POA: Diagnosis not present

## 2019-11-18 DIAGNOSIS — Z8249 Family history of ischemic heart disease and other diseases of the circulatory system: Secondary | ICD-10-CM | POA: Diagnosis not present

## 2019-11-18 DIAGNOSIS — E785 Hyperlipidemia, unspecified: Secondary | ICD-10-CM | POA: Diagnosis not present

## 2019-11-18 DIAGNOSIS — Z7984 Long term (current) use of oral hypoglycemic drugs: Secondary | ICD-10-CM | POA: Diagnosis not present

## 2019-11-18 DIAGNOSIS — Z791 Long term (current) use of non-steroidal anti-inflammatories (NSAID): Secondary | ICD-10-CM | POA: Diagnosis not present

## 2019-11-18 DIAGNOSIS — E119 Type 2 diabetes mellitus without complications: Secondary | ICD-10-CM | POA: Diagnosis not present

## 2019-11-18 DIAGNOSIS — R69 Illness, unspecified: Secondary | ICD-10-CM | POA: Diagnosis not present

## 2019-11-27 ENCOUNTER — Other Ambulatory Visit: Payer: Self-pay | Admitting: Internal Medicine

## 2019-11-27 DIAGNOSIS — E1165 Type 2 diabetes mellitus with hyperglycemia: Secondary | ICD-10-CM

## 2019-12-08 ENCOUNTER — Other Ambulatory Visit: Payer: Self-pay

## 2019-12-08 ENCOUNTER — Ambulatory Visit: Payer: Medicare HMO | Attending: Internal Medicine | Admitting: Internal Medicine

## 2019-12-08 ENCOUNTER — Encounter: Payer: Self-pay | Admitting: Internal Medicine

## 2019-12-08 VITALS — BP 117/75 | HR 75 | Resp 16 | Ht 63.0 in | Wt 126.8 lb

## 2019-12-08 DIAGNOSIS — I10 Essential (primary) hypertension: Secondary | ICD-10-CM | POA: Diagnosis not present

## 2019-12-08 DIAGNOSIS — E1169 Type 2 diabetes mellitus with other specified complication: Secondary | ICD-10-CM | POA: Diagnosis not present

## 2019-12-08 DIAGNOSIS — E785 Hyperlipidemia, unspecified: Secondary | ICD-10-CM | POA: Diagnosis not present

## 2019-12-08 DIAGNOSIS — E1165 Type 2 diabetes mellitus with hyperglycemia: Secondary | ICD-10-CM | POA: Diagnosis not present

## 2019-12-08 DIAGNOSIS — E119 Type 2 diabetes mellitus without complications: Secondary | ICD-10-CM | POA: Diagnosis not present

## 2019-12-08 DIAGNOSIS — Z2821 Immunization not carried out because of patient refusal: Secondary | ICD-10-CM | POA: Diagnosis not present

## 2019-12-08 DIAGNOSIS — Z5320 Procedure and treatment not carried out because of patient's decision for unspecified reasons: Secondary | ICD-10-CM

## 2019-12-08 LAB — GLUCOSE, POCT (MANUAL RESULT ENTRY): POC Glucose: 209 mg/dl — AB (ref 70–99)

## 2019-12-08 LAB — POCT GLYCOSYLATED HEMOGLOBIN (HGB A1C): HbA1c, POC (controlled diabetic range): 8.6 % — AB (ref 0.0–7.0)

## 2019-12-08 MED ORDER — LISINOPRIL-HYDROCHLOROTHIAZIDE 20-12.5 MG PO TABS: ORAL_TABLET | ORAL | 6 refills | Status: DC

## 2019-12-08 MED ORDER — SIMVASTATIN 20 MG PO TABS: 20.0000 mg | ORAL_TABLET | Freq: Every day | ORAL | 1 refills | Status: DC

## 2019-12-08 MED ORDER — SITAGLIPTIN PHOSPHATE 25 MG PO TABS: 12.5000 mg | ORAL_TABLET | Freq: Every day | ORAL | 6 refills | Status: DC

## 2019-12-08 MED ORDER — GLIPIZIDE 10 MG PO TABS: 10.0000 mg | ORAL_TABLET | Freq: Two times a day (BID) | ORAL | 6 refills | Status: DC

## 2019-12-08 MED ORDER — METFORMIN HCL 1000 MG PO TABS: ORAL_TABLET | ORAL | 6 refills | Status: DC

## 2019-12-08 NOTE — Patient Instructions (Signed)
Stop amlodipine.  It is the most likely cause for the swelling in your legs.  Restart lisinopril/hydrochlorothiazide.  Check your blood pressure at least twice a week with goal being 130/80 or lower.

## 2019-12-08 NOTE — Progress Notes (Signed)
Patient ID: Michele [name], female    DOB: 01/18/1951  MRN: [id number]  CC: Diabetes and Hypertension   Subjective: Michele [name] is a 69 y.o. female who presents for chronic ds management Her concerns today include:  Patient with history of diabetes type 2, HTN,HL, vaginal prolapse.   HYPERTENSION Currently taking: see medication list Med Adherence: _0  Yes    _1  No - Norvasc added to Lisinopril/HCTZ on last visit.  She was afraid to take both so she has been taking just the Amlodipine.  However, of note she also has Lisinopril/HCTZ bottle with her today.   Medication side effects: _2  Yes    _3  No Adherence with salt restriction: _4  Yes    _5  No Home Monitoring?: _6  Yes    _7  No Monitoring Frequency: _8  Yes    _9  No Home BP results range: Reports BP has been good on just thae Amlodipine SOB? _10  Yes    _11  No Chest Pain?: _12  Yes    _13  No Leg swelling?: _14  Yes -swelling in lower legs x several wks.  Worse during the day.  Goes down some over night.  No PND, orthopnea Headaches?: _15  Yes    _16  No Dizziness? _17  Yes    _18  No Comments:    DIABETES TYPE 2 Last A1C:   Results for orders placed or performed in visit on 12/08/19  POCT glucose (manual entry)  Result Value Ref Range   POC Glucose 209 (A) 70 - 99 mg/dl  POCT glycosylated hemoglobin (Hb A1C)  Result Value Ref Range   Hemoglobin A1C     HbA1c POC (<> result, manual entry)     HbA1c, POC (prediabetic range)     HbA1c, POC (controlled diabetic range) 8.6 (A) 0.0 - 7.0 %    Med Adherence:  Did not get Invokana because she does not want "all those meds" and was not covered.  Did start takinf Januvia again but not every day Medication side effects:  _19  Yes    _20  No Home Monitoring?  _21  Yes    _22  No Home glucose results range:"sometimes good and sometimes in the 200s in the mornings. Diet Adherence: _23  Yes  -tries not to eat rice more than 2 x a wk, eating less sugary stuff.  Drinks water and juice but she  dilutes the juice Exercise: _24  Yes - stays busy.  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 _27  Yes    _28  No Retinopathy hx? _29  Yes    _30  No Last eye exam:  Comments:   HM:  Declines COVID vaccine.  Declines MMG.   Patient Active Problem List   Diagnosis Date Noted  . Hyperlipidemia associated with type 2 diabetes mellitus ([place]) 07/02/2019  . COVID-19 virus vaccination declined 06/30/2019  . Complete uterine prolapse 10/27/2016  . Controlled type 2 diabetes mellitus without complication, without long-term current use of insulin ([place]) 09/28/2016  . Essential hypertension 09/28/2016  . Hyperlipidemia 09/28/2016  . Vaginal prolapse 09/28/2016     Current Outpatient Medications on File Prior to Visit  Medication Sig Dispense Refill  . Blood Glucose Monitoring Suppl (TRUE METRIX METER) w/Device KIT 1 each by Does not apply route 3 (three) times daily. 1 kit 0  . diclofenac (VOLTAREN) 50 MG EC tablet Take 1 tablet (50 mg total) by mouth 2 (two) times daily. 30 tablet  0  . estradiol (ESTRACE) 0.1 MG/GM vaginal cream Apply 1 gram per vagina every night for 2 weeks, then apply three times a week 30 g 12  . glucose blood (ONETOUCH VERIO) test strip USE AS INSTRUCTED 100 strip 0  . tiZANidine (ZANAFLEX) 4 MG tablet Take 1 tablet (4 mg total) by mouth every 6 (six) hours as needed for muscle spasms. 30 tablet 0  . TRUEPLUS LANCETS 28G MISC 1 each by Does not apply route 3 (three) times daily. 100 each 12  . [DISCONTINUED] amLODipine (NORVASC) 5 MG tablet Take 1 tablet (5 mg total) by mouth daily. 30 tablet 5   No current facility-administered medications on file prior to visit.    Allergies  Allergen Reactions  . Wilder Glade [Dapagliflozin] Other (See Comments)    Dizzy  . Januvia [Sitagliptin] Other (See Comments)    Gives heartburn and upsets her stomach    Social History   Socioeconomic  History  . Marital status: Married    Spouse name: Not on file  . Number of children: 2  . Years of education: Not on file  . Highest education level: Not on file  Occupational History  . Occupation: LPN  Tobacco Use  . Smoking status: Never Smoker  . Smokeless tobacco: Never Used  Substance and Sexual Activity  . Alcohol use: No  . Drug use: No  . Sexual activity: Yes  Other Topics Concern  . Not on file  Social History Narrative  . Not on file   Social Determinants of Health   Financial Resource Strain:   . Difficulty of Paying Living Expenses:   Food Insecurity:   . Worried About Charity fundraiser in the Last Year:   . Arboriculturist in the Last Year:   Transportation Needs:   . Film/video editor (Medical):   Marland Kitchen Lack of Transportation (Non-Medical):   Physical Activity:   . Days of Exercise per Week:   . Minutes of Exercise per Session:   Stress:   . Feeling of Stress :   Social Connections:   . Frequency of Communication with Friends and Family:   . Frequency of Social Gatherings with Friends and Family:   . Attends Religious Services:   . Active Member of Clubs or Organizations:   . Attends Archivist Meetings:   Marland Kitchen Marital Status:   Intimate Partner Violence:   . Fear of Current or Ex-Partner:   . Emotionally Abused:   Marland Kitchen Physically Abused:   . Sexually Abused:     Family History  Problem Relation Age of Onset  . Diabetes Mother   . Hypertension Mother   . Diabetes Father   . Hypertension Father   . Diabetes Brother   . Hypertension Brother     No past surgical history on file.  ROS: Review of Systems Negative except as stated above  PHYSICAL EXAM: BP 117/75   Pulse 75   Resp 16   Ht _0  (1.6 m)   Wt 126 lb 12.8 oz (57.5 kg)   SpO2 95%   BMI 22.46 kg/m   Wt Readings from Last 3 Encounters:  12/08/19 126 lb 12.8 oz (57.5 kg)  02/25/18 133 lb 3.2 oz (60.4 kg)  06/29/17 131 lb 3.2 oz (59.5 kg)    Physical  Exam  General appearance - alert, well appearing, and in no distress Mental status - normal mood, behavior, speech, dress, motor activity, and thought processes Eyes - pupils equal  and reactive, extraocular eye movements intact Neck - supple, no significant adenopathy Chest - clear to auscultation, no wheezes, rales or rhonchi, symmetric air entry Heart - normal rate, regular rhythm, normal S1, S2, no murmurs, rubs, clicks or gallops Extremities - peripheral pulses normal, 1+ edema from lower 1/3 shin down to include ankle BL Diabetic Foot Exam - Simple   Simple Foot Form Visual Inspection No deformities, no ulcerations, no other skin breakdown bilaterally: Yes Sensation Testing Intact to touch and monofilament testing bilaterally: Yes Pulse Check Posterior Tibialis and Dorsalis pulse intact bilaterally: Yes Comments      CMP Latest Ref Rng & Units 06/09/2019 02/25/2018 06/29/2017  Glucose 65 - 99 mg/dL 188(H) 165(H) 144(H)  BUN 8 - 27 mg/dL 5(L) 9 7(L)  Creatinine 0.57 - 1.00 mg/dL 0.66 0.60 0.58  Sodium 134 - 144 mmol/L 139 138 142  Potassium 3.5 - 5.2 mmol/L 4.1 3.8 4.1  Chloride 96 - 106 mmol/L 98 97 99  CO2 20 - 29 mmol/L _0 Calcium 8.7 - 10.3 mg/dL 9.9 9.6 10.1  Total Protein 6.0 - 8.5 g/dL 7.1 6.8 -  Total Bilirubin 0.0 - 1.2 mg/dL 0.4 <0.2 -  Alkaline Phos 39 - 117 IU/L 77 77 -  AST 0 - 40 IU/L 21 34 -  ALT 0 - 32 IU/L 15 35(H) -   Lipid Panel     Component Value Date/Time   CHOL 151 06/09/2019 1201   TRIG 151 (H) 06/09/2019 1201   HDL 74 06/09/2019 1201   CHOLHDL 2.0 06/09/2019 1201   LDLCALC 52 06/09/2019 1201    CBC    Component Value Date/Time   WBC 8.5 06/09/2019 1201   RBC 4.82 06/09/2019 1201   HGB 13.1 06/09/2019 1201   HCT 40.8 06/09/2019 1201   PLT 332 06/09/2019 1201   MCV 85 06/09/2019 1201   MCH 27.2 06/09/2019 1201   MCHC 32.1 06/09/2019 1201   RDW 14.1 06/09/2019 1201    ASSESSMENT AND PLAN: 1. Type 2 diabetes mellitus with  hyperglycemia, without long-term current use of insulin (HCC) A1c has improved but not at goal.  She will continue Metformin and Glucotrol.  In the past she states that she was intolerant of Januvia but now she tells me that she is started taking the Januvia sometimes every other day.  Advise giving a trial of low-dose Januvia to take daily.  Dietary counseling given. - POCT glucose (manual entry) - POCT glycosylated hemoglobin (Hb A1C) - metFORMIN (GLUCOPHAGE) 1000 MG tablet; TAKE 1 TABLET (1,000 MG TOTAL) BY MOUTH 2 TIMES DAILY WITH A MEAL.  Dispense: 60 tablet; Refill: 6 - glipiZIDE (GLUCOTROL) 10 MG tablet; Take 1 tablet (10 mg total) by mouth 2 (two) times daily. Please make PCP appointment.  Dispense: 60 tablet; Refill: 6 - sitaGLIPtin (JANUVIA) 25 MG tablet; Take 0.5 tablets (12.5 mg total) by mouth daily. Reports tolerating Januvia  Dispense: 30 tablet; Refill: 6 - Ambulatory referral to Ophthalmology  2. Hyperlipidemia associated with type 2 diabetes mellitus (HCC) - simvastatin (ZOCOR) 20 MG tablet; Take 1 tablet (20 mg total) by mouth daily.  Dispense: 90 tablet; Refill: 1  3. Essential hypertension At goal.  However we will stop amlodipine because it is most likely causing the lower extremity edema.  Advised to go back to taking lisinopril/hydrochlorothiazide.  Told to monitor blood pressure at least twice a week.  If she finds the blood pressure is too low on the 20/12.5 mg tablet, she can  try cutting it in half. - lisinopril-hydrochlorothiazide (ZESTORETIC) 20-12.5 MG tablet; Take 1 tab PO daily  Dispense: 30 tablet; Refill: 6  4. COVID-19 virus vaccination declined Discussed with patient that the COVID-19 vaccines are relatively safe and effective.  However she declines being vaccinated.  5. Mammogram declined   Patient was given the opportunity to ask questions.  Patient verbalized understanding of the plan and was able to repeat key elements of the plan.   Orders Placed This  Encounter  Procedures  . Ambulatory referral to Ophthalmology  . POCT glucose (manual entry)  . POCT glycosylated hemoglobin (Hb A1C)     Requested Prescriptions   Signed Prescriptions Disp Refills  . metFORMIN (GLUCOPHAGE) 1000 MG tablet 60 tablet 6    Sig: TAKE 1 TABLET (1,000 MG TOTAL) BY MOUTH 2 TIMES DAILY WITH A MEAL.  Marland Kitchen glipiZIDE (GLUCOTROL) 10 MG tablet 60 tablet 6    Sig: Take 1 tablet (10 mg total) by mouth 2 (two) times daily. Please make PCP appointment.  . sitaGLIPtin (JANUVIA) 25 MG tablet 30 tablet 6    Sig: Take 0.5 tablets (12.5 mg total) by mouth daily. Reports tolerating Januvia  . simvastatin (ZOCOR) 20 MG tablet 90 tablet 1    Sig: Take 1 tablet (20 mg total) by mouth daily.  Marland Kitchen lisinopril-hydrochlorothiazide (ZESTORETIC) 20-12.5 MG tablet 30 tablet 6    Sig: Take 1 tab PO daily    Return in about 4 months (around 04/09/2020).  Karle Plumber, MD, FACP

## 2020-01-01 ENCOUNTER — Other Ambulatory Visit: Payer: Self-pay | Admitting: Internal Medicine

## 2020-01-01 DIAGNOSIS — E1165 Type 2 diabetes mellitus with hyperglycemia: Secondary | ICD-10-CM

## 2020-01-09 ENCOUNTER — Other Ambulatory Visit: Payer: Self-pay | Admitting: Internal Medicine

## 2020-01-09 DIAGNOSIS — I10 Essential (primary) hypertension: Secondary | ICD-10-CM

## 2020-02-02 ENCOUNTER — Other Ambulatory Visit: Payer: Self-pay | Admitting: Internal Medicine

## 2020-02-02 DIAGNOSIS — R69 Illness, unspecified: Secondary | ICD-10-CM | POA: Diagnosis not present

## 2020-02-02 DIAGNOSIS — E119 Type 2 diabetes mellitus without complications: Secondary | ICD-10-CM

## 2020-03-15 ENCOUNTER — Ambulatory Visit: Payer: Self-pay

## 2020-03-15 DIAGNOSIS — E1165 Type 2 diabetes mellitus with hyperglycemia: Secondary | ICD-10-CM

## 2020-03-15 NOTE — Telephone Encounter (Signed)
Patient called and says the dosage of Januvia was decreased to 1/2 tablet. She says when she cuts them in half, they become powder, because the pill is so little. She says she was paying $9 for 30 pills and now is paying the same for 15 pills. She is asking if the doctor will increase the dosage back to 25 mg, otherwise she will not take it because of the cost being the same for 15 and 30. She says she's not getting the right dosage with the pills breaking into powder when she cuts them. I advised I will send this to Dr. Laural Benes and someone will call from the office with her recommendation. Patient verbalized understanding.  Reason for Disposition . [1] Caller has NON-URGENT medicine question about med that PCP prescribed AND [2] triager unable to answer question  Answer Assessment - Initial Assessment Questions 1. NAME of MEDICATION: "What medicine are you calling about?"     Januvia 2. QUESTION: "What is your question?" (e.g., medication refill, side effect)     I would like the dosage changed 3. PRESCRIBING HCP: "Who prescribed it?" Reason: if prescribed by specialist, call should be referred to that group.     Dr. Laural Benes 4. SYMPTOMS: "Do you have any symptoms?"     None 5. SEVERITY: If symptoms are present, ask "Are they mild, moderate or severe?"     N/A 6. PREGNANCY:  "Is there any chance that you are pregnant?" "When was your last menstrual period?"     No  Protocols used: MEDICATION QUESTION CALL-A-AH

## 2020-03-16 MED ORDER — SITAGLIPTIN PHOSPHATE 25 MG PO TABS: 25.0000 mg | ORAL_TABLET | Freq: Every day | ORAL | 6 refills | Status: DC

## 2020-03-16 NOTE — Addendum Note (Signed)
Addended by: Jonah Blue B on: 03/16/2020 09:36 AM   Modules accepted: Orders

## 2020-03-18 NOTE — Telephone Encounter (Signed)
Called pt made aware of MD message . Verbalized understanding  ?

## 2020-05-04 DIAGNOSIS — R69 Illness, unspecified: Secondary | ICD-10-CM | POA: Diagnosis not present

## 2020-05-29 ENCOUNTER — Other Ambulatory Visit: Payer: Self-pay | Admitting: Internal Medicine

## 2020-05-29 DIAGNOSIS — I10 Essential (primary) hypertension: Secondary | ICD-10-CM

## 2020-05-29 DIAGNOSIS — E1165 Type 2 diabetes mellitus with hyperglycemia: Secondary | ICD-10-CM

## 2020-05-29 DIAGNOSIS — E1169 Type 2 diabetes mellitus with other specified complication: Secondary | ICD-10-CM

## 2020-05-29 DIAGNOSIS — E785 Hyperlipidemia, unspecified: Secondary | ICD-10-CM

## 2020-06-14 ENCOUNTER — Telehealth: Payer: Self-pay | Admitting: Internal Medicine

## 2020-06-14 DIAGNOSIS — E1165 Type 2 diabetes mellitus with hyperglycemia: Secondary | ICD-10-CM

## 2020-06-14 DIAGNOSIS — E785 Hyperlipidemia, unspecified: Secondary | ICD-10-CM

## 2020-06-14 DIAGNOSIS — E1169 Type 2 diabetes mellitus with other specified complication: Secondary | ICD-10-CM

## 2020-06-14 DIAGNOSIS — I10 Essential (primary) hypertension: Secondary | ICD-10-CM

## 2020-06-30 ENCOUNTER — Other Ambulatory Visit: Payer: Self-pay | Admitting: Internal Medicine

## 2020-06-30 DIAGNOSIS — I10 Essential (primary) hypertension: Secondary | ICD-10-CM

## 2020-07-01 ENCOUNTER — Other Ambulatory Visit: Payer: Self-pay | Admitting: *Deleted

## 2020-07-01 DIAGNOSIS — E1165 Type 2 diabetes mellitus with hyperglycemia: Secondary | ICD-10-CM

## 2020-07-01 MED ORDER — GLIPIZIDE 10 MG PO TABS: 10.0000 mg | ORAL_TABLET | Freq: Two times a day (BID) | ORAL | 0 refills | Status: DC

## 2020-07-01 NOTE — Telephone Encounter (Addendum)
Patient scheduled for 07/03/2020 with [facility] for medication refill appointment. Patient requesting short supply of glipiZIDE (GLUCOTROL) 10 MG tablet  to hold her over until. Patient contacted pharmacy and was advised appointment was needed.      [id number] [place], [place] - [address]  [address] [place] [address]  Phone:  [phone] Fax:  [phone]

## 2020-07-01 NOTE — Telephone Encounter (Signed)
Patient specifically ask for  glipiZIDE (GLUCOTROL) 10 MG tablet to hold her over until scheduled appointment. Patient did not mention any other medication.  [id number] [place], [place] - [address]  [address] [place] [address]  Phone:  [phone] Fax:  [phone]  .rx

## 2020-07-01 NOTE — Telephone Encounter (Signed)
Per intitial request, " Patient scheduled for 07/03/2020 with [facility] for medication refill appointment. Patient requesting short supply of glipiZIDE (GLUCOTROL) 10 MG tablet to hold her over until. Patient contacted pharmacy and was advised appointment was needed"; Attempted to contact pt to determine what medications need to be refilled; left message on voicemail; also see request for 06/14/20. 2 weeks ago    Orders Placed This Encounter   Refused  amLODipine (NORVASC) 5 MG tablet [Pharmacy Med Name: AMLODIPINE BESYLATE 5 MG TAB] Ordered On: 06/14/2020  JANUVIA 50 MG tablet [Pharmacy Med Name: JANUVIA 50 MG TABLET] Ordered On: 06/14/2020  lisinopril-hydrochlorothiazide (ZESTORETIC) 20-12.5 MG tablet [Pharmacy Med Name: LISINOPRIL-HCTZ 20-12.5 MG TAB] Ordered On: 06/14/2020  metFORMIN (GLUCOPHAGE) 1000 MG tablet [Pharmacy Med Name: METFORMIN HCL 1,000 MG TABLET] Ordered On: 06/14/2020  simvastatin (ZOCOR) 20 MG tablet [Pharmacy Med Name: SIMVASTATIN 20 MG TABLET] Ordered On: 06/14/2020  glipiZIDE (GLUCOTROL) 10 MG tablet [Pharmacy Med Name: GLIPIZIDE 10 MG TABLET]

## 2020-07-01 NOTE — Telephone Encounter (Signed)
Patient specifically ask for  glipiZIDE (GLUCOTROL) 10 MG tablet to hold her over until scheduled appointment. Patient did not mention any other medication.      [id number] [place], [place] - [address]  [address] [place] [address]  Phone:  [phone] Fax:  [phone]

## 2020-07-01 NOTE — Addendum Note (Signed)
Addended by: Redmond Baseman on: 07/01/2020 02:24 PM   Modules accepted: Orders

## 2020-07-01 NOTE — Telephone Encounter (Signed)
Requested Prescriptions  Pending Prescriptions Disp Refills  . glipiZIDE (GLUCOTROL) 10 MG tablet 6 tablet 0    Sig: Take 1 tablet (10 mg total) by mouth 2 (two) times daily. Please make PCP appointment.     Endocrinology:  Diabetes - Sulfonylureas Failed - 07/01/2020  2:24 PM      Failed - HBA1C is between 0 and 7.9 and within 180 days    HbA1c, POC (controlled diabetic range)  Date Value Ref Range Status  12/08/2019 8.6 (A) 0.0 - 7.0 % Final         Failed - Valid encounter within last 6 months    Recent Outpatient Visits          6 months ago Type 2 diabetes mellitus with hyperglycemia, without long-term current use of insulin (HCC)   [place] [facility] Jonah Blue B, MD   1 year ago Type 2 diabetes mellitus without complication, without long-term current use of insulin (HCC)   [place] [facility] Marcine Matar, MD   1 year ago Hyperlipidemia, unspecified hyperlipidemia type   [facility] Marcine Matar, MD   1 year ago Diabetes mellitus type 2, uncontrolled, without complications [facility])   [place] [facility] Jonah Blue B, MD   2 years ago Diabetes mellitus type 2, uncontrolled, without complications [facility])   [place] [facility] Marcine Matar, MD      Future Appointments            In 2 days Sharon Seller, [place] [facility]

## 2020-07-02 ENCOUNTER — Other Ambulatory Visit: Payer: Self-pay | Admitting: Internal Medicine

## 2020-07-02 DIAGNOSIS — E1165 Type 2 diabetes mellitus with hyperglycemia: Secondary | ICD-10-CM

## 2020-07-02 NOTE — Telephone Encounter (Signed)
   Notes to clinic: Patient has appt tomorrow Given 6 tabs to cover until appt    Requested Prescriptions  Pending Prescriptions Disp Refills   glipiZIDE (GLUCOTROL) 10 MG tablet [Pharmacy Med Name: GLIPIZIDE 10 MG TABLET] 180 tablet 1    Sig: Take 1 tablet (10 mg total) by mouth 2 (two) times daily. Please make PCP appointment.      Endocrinology:  Diabetes - Sulfonylureas Failed - 07/02/2020 10:49 AM      Failed - HBA1C is between 0 and 7.9 and within 180 days    HbA1c, POC (controlled diabetic range)  Date Value Ref Range Status  12/08/2019 8.6 (A) 0.0 - 7.0 % Final          Failed - Valid encounter within last 6 months    Recent Outpatient Visits           6 months ago Type 2 diabetes mellitus with hyperglycemia, without long-term current use of insulin (HCC)   [place] [facility] Jonah Blue B, MD   1 year ago Type 2 diabetes mellitus without complication, without long-term current use of insulin (HCC)   [place] [facility] Marcine Matar, MD   1 year ago Hyperlipidemia, unspecified hyperlipidemia type   [facility] Marcine Matar, MD   1 year ago Diabetes mellitus type 2, uncontrolled, without complications [facility])   [place] [facility] Jonah Blue B, MD   2 years ago Diabetes mellitus type 2, uncontrolled, without complications [facility])   [place] [facility] Marcine Matar, MD       Future Appointments             Tomorrow Sharon Seller, [place] [facility]

## 2020-07-03 ENCOUNTER — Ambulatory Visit: Payer: Medicare HMO | Admitting: Physician Assistant

## 2020-07-04 ENCOUNTER — Other Ambulatory Visit: Payer: Self-pay | Admitting: Internal Medicine

## 2020-07-04 DIAGNOSIS — E1165 Type 2 diabetes mellitus with hyperglycemia: Secondary | ICD-10-CM

## 2020-07-04 NOTE — Telephone Encounter (Signed)
   Notes to clinic:  Patient was schedule for 07/03/2020 but has now schedule for 08/01/2020 Requesting another short supply until then  Requested Prescriptions  Pending Prescriptions Disp Refills   glipiZIDE (GLUCOTROL) 10 MG tablet 6 tablet 0    Sig: Take 1 tablet (10 mg total) by mouth 2 (two) times daily. Please make PCP appointment.      Endocrinology:  Diabetes - Sulfonylureas Failed - 07/04/2020  2:19 PM      Failed - HBA1C is between 0 and 7.9 and within 180 days    HbA1c, POC (controlled diabetic range)  Date Value Ref Range Status  12/08/2019 8.6 (A) 0.0 - 7.0 % Final          Failed - Valid encounter within last 6 months    Recent Outpatient Visits           6 months ago Type 2 diabetes mellitus with hyperglycemia, without long-term current use of insulin (HCC)   [place] [facility] Jonah Blue B, MD   1 year ago Type 2 diabetes mellitus without complication, without long-term current use of insulin (HCC)   [place] [facility] Marcine Matar, MD   1 year ago Hyperlipidemia, unspecified hyperlipidemia type   [facility] Marcine Matar, MD   1 year ago Diabetes mellitus type 2, uncontrolled, without complications [facility])   [place] [facility] Jonah Blue B, MD   2 years ago Diabetes mellitus type 2, uncontrolled, without complications [facility])   [place] [facility] Marcine Matar, MD       Future Appointments             In 4 weeks Sharon Seller, [place] [facility]

## 2020-07-04 NOTE — Telephone Encounter (Signed)
Medication Refill - Medication: glipiZIDE (GLUCOTROL) 10 MG tablet     Preferred Pharmacy (with phone number or street name): [id number] [place], [place] - [address] Phone:  [phone]  Fax:  [phone]       Agent: Please be advised that RX refills may take up to 3 business days. We ask that you follow-up with your pharmacy.

## 2020-07-05 MED ORDER — GLIPIZIDE 10 MG PO TABS: 10.0000 mg | ORAL_TABLET | Freq: Two times a day (BID) | ORAL | 0 refills | Status: DC

## 2020-07-25 ENCOUNTER — Other Ambulatory Visit: Payer: Self-pay | Admitting: Internal Medicine

## 2020-07-25 DIAGNOSIS — I10 Essential (primary) hypertension: Secondary | ICD-10-CM

## 2020-07-25 NOTE — Telephone Encounter (Signed)
Requested medications are due for refill today.  yes  Requested medications are on the active medications list.  yes  Last refill. 05/29/2020  Future visit scheduled.   yes  Notes to clinic.  Courtesy refill already given.

## 2020-07-26 DIAGNOSIS — Z7984 Long term (current) use of oral hypoglycemic drugs: Secondary | ICD-10-CM | POA: Diagnosis not present

## 2020-07-26 DIAGNOSIS — H5203 Hypermetropia, bilateral: Secondary | ICD-10-CM | POA: Diagnosis not present

## 2020-07-26 DIAGNOSIS — H25813 Combined forms of age-related cataract, bilateral: Secondary | ICD-10-CM | POA: Diagnosis not present

## 2020-07-26 DIAGNOSIS — E119 Type 2 diabetes mellitus without complications: Secondary | ICD-10-CM | POA: Diagnosis not present

## 2020-07-26 DIAGNOSIS — H524 Presbyopia: Secondary | ICD-10-CM | POA: Diagnosis not present

## 2020-07-26 LAB — HM DIABETES EYE EXAM

## 2020-07-28 ENCOUNTER — Other Ambulatory Visit: Payer: Self-pay | Admitting: Internal Medicine

## 2020-07-28 DIAGNOSIS — E1165 Type 2 diabetes mellitus with hyperglycemia: Secondary | ICD-10-CM

## 2020-07-28 NOTE — Telephone Encounter (Signed)
Requested Prescriptions  Pending Prescriptions Disp Refills  . glipiZIDE (GLUCOTROL) 10 MG tablet [Pharmacy Med Name: GLIPIZIDE 10 MG TABLET] 180 tablet 0    Sig: TAKE 1 TABLET BY MOUTH TWICE A DAY     Endocrinology:  Diabetes - Sulfonylureas Failed - 07/28/2020 12:32 PM      Failed - HBA1C is between 0 and 7.9 and within 180 days    HbA1c, POC (controlled diabetic range)  Date Value Ref Range Status  12/08/2019 8.6 (A) 0.0 - 7.0 % Final         Failed - Valid encounter within last 6 months    Recent Outpatient Visits          7 months ago Type 2 diabetes mellitus with hyperglycemia, without long-term current use of insulin (HCC)   [place] [facility] [place], Gavin Pound B, MD   1 year ago Type 2 diabetes mellitus without complication, without long-term current use of insulin (HCC)   [place] [facility] Marcine Matar, MD   1 year ago Hyperlipidemia, unspecified hyperlipidemia type   [place] [facility] Marcine Matar, MD   1 year ago Diabetes mellitus type 2, uncontrolled, without complications [facility])   [place] [facility] Jonah Blue B, MD   2 years ago Diabetes mellitus type 2, uncontrolled, without complications [facility])   [place] [facility] Marcine Matar, MD      Future Appointments            In 1 month Laural Benes, Binnie Rail, MD [facility]

## 2020-07-30 ENCOUNTER — Other Ambulatory Visit: Payer: Self-pay | Admitting: Internal Medicine

## 2020-07-30 DIAGNOSIS — E1165 Type 2 diabetes mellitus with hyperglycemia: Secondary | ICD-10-CM

## 2020-07-30 DIAGNOSIS — I10 Essential (primary) hypertension: Secondary | ICD-10-CM

## 2020-07-30 DIAGNOSIS — E1169 Type 2 diabetes mellitus with other specified complication: Secondary | ICD-10-CM

## 2020-07-30 DIAGNOSIS — E119 Type 2 diabetes mellitus without complications: Secondary | ICD-10-CM

## 2020-07-30 NOTE — Telephone Encounter (Signed)
Requested medications are due for refill today.  yes  Requested medications are on the active medications list.  yes  Last refill. There are several medications  Future visit scheduled.   yes  Notes to clinic.  Some already given courtesy refills. New request for Glucometer. And 1 was just refilled a few days ago.

## 2020-07-30 NOTE — Telephone Encounter (Signed)
Medication Refill - Medication: simvastatin (ZOCOR) 20 MG tablet  glipiZIDE (GLUCOTROL) 10 MG tablet  lisinopril-hydrochlorothiazide (ZESTORETIC) 20-12.5 MG tablet sitaGLIPtin (JANUVIA) 25 MG tablet Blood Glucose Monitoring Suppl (TRUE METRIX METER) w/Device KIT  True Metrix Test Strips  TRUEPLUS LANCETS 28G MISC And alcohol swabs    Pt wants these Rxs via [employer]  Has the patient contacted their pharmacy? Yes.   (Agent: If no, request that the patient contact the pharmacy for the refill.) (Agent: If yes, when and what did the pharmacy advise?)  Preferred Pharmacy (with phone number or street name):  [place], [place]  [place] [address]  Phone: [phone] Fax: [phone]     Agent: Please be advised that RX refills may take up to 3 business days. We ask that you follow-up with your pharmacy.

## 2020-08-01 ENCOUNTER — Ambulatory Visit: Payer: Medicare HMO | Admitting: Physician Assistant

## 2020-08-01 MED ORDER — TRUE METRIX METER W/DEVICE KIT: PACK | 0 refills | Status: DC

## 2020-08-01 MED ORDER — TRUEPLUS LANCETS 28G MISC: 0 refills | Status: DC

## 2020-08-01 MED ORDER — LISINOPRIL-HYDROCHLOROTHIAZIDE 20-12.5 MG PO TABS: 1.0000 | ORAL_TABLET | Freq: Every day | ORAL | 0 refills | Status: DC

## 2020-08-01 MED ORDER — GLIPIZIDE 10 MG PO TABS: 10.0000 mg | ORAL_TABLET | Freq: Two times a day (BID) | ORAL | 0 refills | Status: DC

## 2020-08-01 MED ORDER — SITAGLIPTIN PHOSPHATE 25 MG PO TABS: 25.0000 mg | ORAL_TABLET | Freq: Every day | ORAL | 0 refills | Status: DC

## 2020-08-01 MED ORDER — SIMVASTATIN 20 MG PO TABS: 20.0000 mg | ORAL_TABLET | Freq: Every day | ORAL | 0 refills | Status: DC

## 2020-08-01 MED ORDER — ONETOUCH VERIO VI STRP: ORAL_STRIP | 0 refills | Status: DC

## 2020-08-05 ENCOUNTER — Other Ambulatory Visit: Payer: Self-pay | Admitting: Internal Medicine

## 2020-08-05 DIAGNOSIS — E1165 Type 2 diabetes mellitus with hyperglycemia: Secondary | ICD-10-CM

## 2020-08-05 DIAGNOSIS — E1169 Type 2 diabetes mellitus with other specified complication: Secondary | ICD-10-CM

## 2020-08-05 NOTE — Telephone Encounter (Signed)
Notes to clinic: Patient is schedule for appointment on 09/20/2020 Review for supply until appointment    Requested Prescriptions  Pending Prescriptions Disp Refills   JANUVIA 50 MG tablet [Pharmacy Med Name: JANUVIA 50 MG TABLET] 90 tablet 1    Sig: TAKE [place]      Endocrinology:  Diabetes - DPP-4 Inhibitors Failed - 08/05/2020  1:00 PM      Failed - HBA1C is between 0 and 7.9 and within 180 days    HbA1c, POC (controlled diabetic range)  Date Value Ref Range Status  12/08/2019 8.6 (A) 0.0 - 7.0 % Final          Failed - Cr in normal range and within 360 days    Creatinine, Ser  Date Value Ref Range Status  06/09/2019 0.66 0.57 - 1.00 mg/dL Final          Failed - Valid encounter within last 6 months    Recent Outpatient Visits           8 months ago Type 2 diabetes mellitus with hyperglycemia, without long-term current use of insulin ([place])   [place], Neoma Laming B, MD   1 year ago Type 2 diabetes mellitus without complication, without long-term current use of insulin ([place])   [place], Deborah B, MD   1 year ago Hyperlipidemia, unspecified hyperlipidemia type   [place], Deborah B, MD   1 year ago Diabetes mellitus type 2, uncontrolled, without complications ([place])   [place], Deborah B, MD   2 years ago Diabetes mellitus type 2, uncontrolled, without complications ([place])   [place], Deborah B, MD       Future Appointments             In 1 month Ladell Pier, MD [place]               simvastatin (ZOCOR) 20 MG tablet [Pharmacy Med Name: SIMVASTATIN 20 MG TABLET] 90 tablet 0    Sig: TAKE 1 TABLET BY MOUTH EVERY DAY      Cardiovascular:  Antilipid - Statins Failed - 08/05/2020  1:00 PM      Failed - Total  Cholesterol in normal range and within 360 days    Cholesterol, Total  Date Value Ref Range Status  06/09/2019 151 100 - 199 mg/dL Final          Failed - LDL in normal range and within 360 days    LDL Chol Calc (NIH)  Date Value Ref Range Status  06/09/2019 52 0 - 99 mg/dL Final          Failed - HDL in normal range and within 360 days    HDL  Date Value Ref Range Status  06/09/2019 74 >39 mg/dL Final          Failed - Triglycerides in normal range and within 360 days    Triglycerides  Date Value Ref Range Status  06/09/2019 151 (H) 0 - 149 mg/dL Final          Passed - Patient is not pregnant      Passed - Valid encounter within last 12 months    Recent Outpatient Visits           8 months ago Type 2 diabetes mellitus with hyperglycemia,  without long-term current use of insulin ([place])   [place] Karle Plumber B, MD   1 year ago Type 2 diabetes mellitus without complication, without long-term current use of insulin [facility])   [place], Deborah B, MD   1 year ago Hyperlipidemia, unspecified hyperlipidemia type   [place], Deborah B, MD   1 year ago Diabetes mellitus type 2, uncontrolled, without complications [facility])   [place], MD   2 years ago Diabetes mellitus type 2, uncontrolled, without complications [facility])   [place], MD       Future Appointments             In 1 month Ladell Pier, MD [place]               glipiZIDE (GLUCOTROL) 10 MG tablet [Pharmacy Med Name: GLIPIZIDE 10 MG TABLET] 60 tablet     Sig: TAKE 1 TABLET (10 MG TOTAL) BY MOUTH 2 (TWO) TIMES DAILY. PLEASE MAKE PCP APPOINTMENT.      Endocrinology:  Diabetes - Sulfonylureas Failed - 08/05/2020  1:00 PM      Failed - HBA1C is between 0 and  7.9 and within 180 days    HbA1c, POC (controlled diabetic range)  Date Value Ref Range Status  12/08/2019 8.6 (A) 0.0 - 7.0 % Final          Failed - Valid encounter within last 6 months    Recent Outpatient Visits           8 months ago Type 2 diabetes mellitus with hyperglycemia, without long-term current use of insulin ([place])   [place], Deborah B, MD   1 year ago Type 2 diabetes mellitus without complication, without long-term current use of insulin ([place])   [place], Deborah B, MD   1 year ago Hyperlipidemia, unspecified hyperlipidemia type   [place], Deborah B, MD   1 year ago Diabetes mellitus type 2, uncontrolled, without complications ([place])   [place], Deborah B, MD   2 years ago Diabetes mellitus type 2, uncontrolled, without complications ([place])   [place], Deborah B, MD       Future Appointments             In 1 month Wynetta Emery, Dalbert Batman, MD [place]               metFORMIN (GLUCOPHAGE) 1000 MG tablet [Pharmacy Med Name: METFORMIN HCL 1,000 MG TABLET] 60 tablet 0    Sig: TAKE 1 TABLET (1,000 MG TOTAL) BY MOUTH 2 TIMES DAILY WITH A MEAL. (NEED OFFICE VISIT)      Endocrinology:  Diabetes - Biguanides Failed - 08/05/2020  1:00 PM      Failed - Cr in normal range and within 360 days    Creatinine, Ser  Date Value Ref Range Status  06/09/2019 0.66 0.57 - 1.00 mg/dL Final          Failed - HBA1C is between 0 and 7.9 and within 180 days    HbA1c, POC (controlled diabetic range)  Date Value Ref Range Status  12/08/2019 8.6 (A) 0.0 - 7.0 % Final  Failed - eGFR in normal range and within 360 days    GFR calc Af Amer  Date Value Ref Range Status  06/09/2019 106 >59 mL/min/1.73 Final   GFR calc non Af Amer  Date Value  Ref Range Status  06/09/2019 92 >59 mL/min/1.73 Final          Failed - Valid encounter within last 6 months    Recent Outpatient Visits           8 months ago Type 2 diabetes mellitus with hyperglycemia, without long-term current use of insulin ([place])   [place], Neoma Laming B, MD   1 year ago Type 2 diabetes mellitus without complication, without long-term current use of insulin ([place])   [place], Deborah B, MD   1 year ago Hyperlipidemia, unspecified hyperlipidemia type   [place], Deborah B, MD   1 year ago Diabetes mellitus type 2, uncontrolled, without complications [facility])   [place], MD   2 years ago Diabetes mellitus type 2, uncontrolled, without complications [facility])   [place], MD       Future Appointments             In 1 month Wynetta Emery, Dalbert Batman, MD [place]

## 2020-08-09 DIAGNOSIS — H2513 Age-related nuclear cataract, bilateral: Secondary | ICD-10-CM | POA: Diagnosis not present

## 2020-08-09 DIAGNOSIS — H25013 Cortical age-related cataract, bilateral: Secondary | ICD-10-CM | POA: Diagnosis not present

## 2020-08-11 DIAGNOSIS — R300 Dysuria: Secondary | ICD-10-CM | POA: Diagnosis not present

## 2020-08-11 DIAGNOSIS — N3000 Acute cystitis without hematuria: Secondary | ICD-10-CM | POA: Diagnosis not present

## 2020-08-18 ENCOUNTER — Other Ambulatory Visit: Payer: Self-pay | Admitting: Internal Medicine

## 2020-08-18 DIAGNOSIS — I10 Essential (primary) hypertension: Secondary | ICD-10-CM

## 2020-08-19 DIAGNOSIS — N390 Urinary tract infection, site not specified: Secondary | ICD-10-CM | POA: Diagnosis not present

## 2020-08-21 ENCOUNTER — Other Ambulatory Visit: Payer: Self-pay | Admitting: Internal Medicine

## 2020-08-21 DIAGNOSIS — H25012 Cortical age-related cataract, left eye: Secondary | ICD-10-CM | POA: Diagnosis not present

## 2020-08-21 DIAGNOSIS — H2512 Age-related nuclear cataract, left eye: Secondary | ICD-10-CM | POA: Diagnosis not present

## 2020-08-21 DIAGNOSIS — H25011 Cortical age-related cataract, right eye: Secondary | ICD-10-CM | POA: Diagnosis not present

## 2020-08-21 DIAGNOSIS — H2511 Age-related nuclear cataract, right eye: Secondary | ICD-10-CM | POA: Diagnosis not present

## 2020-08-21 DIAGNOSIS — I10 Essential (primary) hypertension: Secondary | ICD-10-CM

## 2020-08-21 MED ORDER — LISINOPRIL-HYDROCHLOROTHIAZIDE 20-12.5 MG PO TABS: 1.0000 | ORAL_TABLET | Freq: Every day | ORAL | 1 refills | Status: DC

## 2020-08-30 ENCOUNTER — Other Ambulatory Visit: Payer: Self-pay | Admitting: Internal Medicine

## 2020-08-30 DIAGNOSIS — E1169 Type 2 diabetes mellitus with other specified complication: Secondary | ICD-10-CM

## 2020-08-30 DIAGNOSIS — E1165 Type 2 diabetes mellitus with hyperglycemia: Secondary | ICD-10-CM

## 2020-08-30 DIAGNOSIS — E785 Hyperlipidemia, unspecified: Secondary | ICD-10-CM

## 2020-08-30 NOTE — Telephone Encounter (Signed)
Requested Prescriptions  Pending Prescriptions Disp Refills  . metFORMIN (GLUCOPHAGE) 1000 MG tablet [Pharmacy Med Name: METFORMIN HCL 1,000 MG TABLET] 42 tablet 0    Sig: TAKE 1 TABLET (1,000 MG TOTAL) BY MOUTH 2 TIMES DAILY WITH A MEAL. (NEED OFFICE VISIT)     Endocrinology:  Diabetes - Biguanides Failed - 08/30/2020  1:31 PM      Failed - Cr in normal range and within 360 days    Creatinine, Ser  Date Value Ref Range Status  06/09/2019 0.66 0.57 - 1.00 mg/dL Final         Failed - HBA1C is between 0 and 7.9 and within 180 days    HbA1c, POC (controlled diabetic range)  Date Value Ref Range Status  12/08/2019 8.6 (A) 0.0 - 7.0 % Final         Failed - eGFR in normal range and within 360 days    GFR calc Af Amer  Date Value Ref Range Status  06/09/2019 106 >59 mL/min/1.73 Final   GFR calc non Af Amer  Date Value Ref Range Status  06/09/2019 92 >59 mL/min/1.73 Final         Failed - Valid encounter within last 6 months    Recent Outpatient Visits          8 months ago Type 2 diabetes mellitus with hyperglycemia, without long-term current use of insulin ([place])   [place], Neoma Laming B, MD   1 year ago Type 2 diabetes mellitus without complication, without long-term current use of insulin ([place])   [place], Deborah B, MD   1 year ago Hyperlipidemia, unspecified hyperlipidemia type   [place], Deborah B, MD   1 year ago Diabetes mellitus type 2, uncontrolled, without complications [facility])   [place], Deborah B, MD   2 years ago Diabetes mellitus type 2, uncontrolled, without complications [facility])   [place], MD      Future Appointments            In 3 weeks Wynetta Emery Dalbert Batman, MD [place]           . simvastatin (ZOCOR) 20 MG tablet  [Pharmacy Med Name: SIMVASTATIN 20 MG TABLET] 30 tablet 0    Sig: TAKE 1 TABLET BY MOUTH EVERY DAY     Cardiovascular:  Antilipid - Statins Failed - 08/30/2020  1:31 PM      Failed - Total Cholesterol in normal range and within 360 days    Cholesterol, Total  Date Value Ref Range Status  06/09/2019 151 100 - 199 mg/dL Final         Failed - LDL in normal range and within 360 days    LDL Chol Calc (NIH)  Date Value Ref Range Status  06/09/2019 52 0 - 99 mg/dL Final         Failed - HDL in normal range and within 360 days    HDL  Date Value Ref Range Status  06/09/2019 74 >39 mg/dL Final         Failed - Triglycerides in normal range and within 360 days    Triglycerides  Date Value Ref Range Status  06/09/2019 151 (H) 0 - 149 mg/dL Final         Passed - Patient is not pregnant  Passed - Valid encounter within last 12 months    Recent Outpatient Visits          8 months ago Type 2 diabetes mellitus with hyperglycemia, without long-term current use of insulin ([place])   [place], Deborah B, MD   1 year ago Type 2 diabetes mellitus without complication, without long-term current use of insulin ([place])   [place], Deborah B, MD   1 year ago Hyperlipidemia, unspecified hyperlipidemia type   [place], Deborah B, MD   1 year ago Diabetes mellitus type 2, uncontrolled, without complications [facility])   [place], MD   2 years ago Diabetes mellitus type 2, uncontrolled, without complications [facility])   [place], MD      Future Appointments            In 3 weeks Wynetta Emery Dalbert Batman, MD [place]

## 2020-09-11 DIAGNOSIS — H25012 Cortical age-related cataract, left eye: Secondary | ICD-10-CM | POA: Diagnosis not present

## 2020-09-11 DIAGNOSIS — H2512 Age-related nuclear cataract, left eye: Secondary | ICD-10-CM | POA: Diagnosis not present

## 2020-09-16 ENCOUNTER — Other Ambulatory Visit: Payer: Self-pay | Admitting: Internal Medicine

## 2020-09-16 DIAGNOSIS — E1165 Type 2 diabetes mellitus with hyperglycemia: Secondary | ICD-10-CM

## 2020-09-20 ENCOUNTER — Other Ambulatory Visit: Payer: Self-pay

## 2020-09-20 ENCOUNTER — Encounter: Payer: Self-pay | Admitting: Internal Medicine

## 2020-09-20 ENCOUNTER — Ambulatory Visit: Payer: Medicare HMO | Attending: Physician Assistant | Admitting: Internal Medicine

## 2020-09-20 VITALS — BP 160/80 | HR 72 | Resp 16 | Wt 126.4 lb

## 2020-09-20 DIAGNOSIS — E1165 Type 2 diabetes mellitus with hyperglycemia: Secondary | ICD-10-CM

## 2020-09-20 DIAGNOSIS — I152 Hypertension secondary to endocrine disorders: Secondary | ICD-10-CM

## 2020-09-20 DIAGNOSIS — E785 Hyperlipidemia, unspecified: Secondary | ICD-10-CM | POA: Diagnosis not present

## 2020-09-20 DIAGNOSIS — Z780 Asymptomatic menopausal state: Secondary | ICD-10-CM | POA: Diagnosis not present

## 2020-09-20 DIAGNOSIS — Z2821 Immunization not carried out because of patient refusal: Secondary | ICD-10-CM

## 2020-09-20 DIAGNOSIS — E1169 Type 2 diabetes mellitus with other specified complication: Secondary | ICD-10-CM

## 2020-09-20 DIAGNOSIS — E1159 Type 2 diabetes mellitus with other circulatory complications: Secondary | ICD-10-CM

## 2020-09-20 DIAGNOSIS — N8110 Cystocele, unspecified: Secondary | ICD-10-CM | POA: Diagnosis not present

## 2020-09-20 LAB — POCT GLYCOSYLATED HEMOGLOBIN (HGB A1C): HbA1c, POC (controlled diabetic range): 8.3 % — AB (ref 0.0–7.0)

## 2020-09-20 LAB — GLUCOSE, POCT (MANUAL RESULT ENTRY): POC Glucose: 198 mg/dl — AB (ref 70–99)

## 2020-09-20 MED ORDER — LISINOPRIL-HYDROCHLOROTHIAZIDE 20-25 MG PO TABS: 1.0000 | ORAL_TABLET | Freq: Every day | ORAL | 3 refills | Status: DC

## 2020-09-20 MED ORDER — GLIPIZIDE 10 MG PO TABS
ORAL_TABLET | ORAL | 2 refills | Status: DC
Start: 2020-09-20 — End: 2020-09-21

## 2020-09-20 MED ORDER — SITAGLIPTIN PHOSPHATE 50 MG PO TABS: 50.0000 mg | ORAL_TABLET | Freq: Every day | ORAL | 2 refills | Status: DC

## 2020-09-20 MED ORDER — SIMVASTATIN 20 MG PO TABS: 20.0000 mg | ORAL_TABLET | Freq: Every day | ORAL | 2 refills | Status: DC

## 2020-09-20 NOTE — Progress Notes (Signed)
Patient ID: Michele [name], female    DOB: 04-02-1951  MRN: [id number]  CC: Diabetes and Hypertension   Subjective: Michele [name] is a 70 y.o. female who presents for chronic ds management Her concerns today include:  Patient with history of diabetes type 2, HTN,HL, vaginal prolapse.  DIABETES TYPE 2 Last A1C:   Results for orders placed or performed in visit on 09/20/20  POCT glucose (manual entry)  Result Value Ref Range   POC Glucose 198 (A) 70 - 99 mg/dl  POCT glycosylated hemoglobin (Hb A1C)  Result Value Ref Range   Hemoglobin A1C     HbA1c POC (<> result, manual entry)     HbA1c, POC (prediabetic range)     HbA1c, POC (controlled diabetic range) 8.3 (A) 0.0 - 7.0 %    Med Adherence:  _0  Yes -Metformin 1 gram BID, Glucotrol 10 mg BID, Januvia 25 mg daily Medication side effects:  _1  Yes    _2  No Home Monitoring?  _3  Yes  2x/day - in am and at bedtime Home glucose results range: 130-150, bedtime 200s Diet Adherence: _4  Yes -eating more organic things, rarely eat bread.  Drinks mostly water and ntural organic fruits like coconut water Exercise: _5  Yes -walks several times a wk Hypoglycemic episodes?: _6  Yes    _7  No Numbness of the feet? _8  Yes    _9  No Retinopathy hx? _10  Yes    _11  No Last eye exam: had cataract surgery RT eye 08/21/20, 09/11/20 on LT Comments:   HTN:  On Lis/HCTZ.  Checks BP but not often.  No chest pains, shortness of breath, lower extremity edema, headaches or dizziness.  HL:  Tolerating Simvastatin  Vaginal prolapse: Request f/u appt with Dr. Clovia Cuff whom she last saw in 2018.  She has a pessary in place.  Reports she is still doing good with the pessary.  HM: declines Declines COVID, Pneumonia vaccines and Tdapt vaccines.  Agrees to DEXA scan  Patient Active Problem List   Diagnosis Date Noted  . Hyperlipidemia associated with type 2 diabetes mellitus ([place]) 07/02/2019  . COVID-19 virus vaccination declined 06/30/2019  . Complete  uterine prolapse 10/27/2016  . Controlled type 2 diabetes mellitus without complication, without long-term current use of insulin ([place]) 09/28/2016  . Essential hypertension 09/28/2016  . Hyperlipidemia 09/28/2016  . Vaginal prolapse 09/28/2016     Current Outpatient Medications on File Prior to Visit  Medication Sig Dispense Refill  . Blood Glucose Monitoring Suppl (TRUE METRIX METER) w/Device KIT Must keep upcoming office visit for refills (Patient not taking: Reported on 09/20/2020) 1 kit 0  . diclofenac (VOLTAREN) 50 MG EC tablet Take 1 tablet (50 mg total) by mouth 2 (two) times daily. (Patient not taking: Reported on 09/20/2020) 30 tablet 0  . estradiol (ESTRACE) 0.1 MG/GM vaginal cream Apply 1 gram per vagina every night for 2 weeks, then apply three times a week 30 g 12  . glucose blood (ONETOUCH VERIO) test strip Must keep upcoming office visit for refills (Patient not taking: Reported on 09/20/2020) 100 strip 0  . metFORMIN (GLUCOPHAGE) 1000 MG tablet TAKE 1 TABLET (1,000 MG TOTAL) BY MOUTH 2 TIMES DAILY WITH A MEAL. (NEED OFFICE VISIT) 42 tablet 0  . tiZANidine (ZANAFLEX) 4 MG tablet Take 1 tablet (4 mg total) by mouth every 6 (six) hours as needed for muscle spasms. (Patient not taking: Reported on 09/20/2020) 30 tablet 0  . TRUEplus Lancets 28G MISC Must keep upcoming office visit for  refills (Patient not taking: Reported on 09/20/2020) 100 each 0  . [DISCONTINUED] amLODipine (NORVASC) 5 MG tablet Take 1 tablet (5 mg total) by mouth daily. 30 tablet 5   No current facility-administered medications on file prior to visit.    Allergies  Allergen Reactions  . Wilder Glade [Dapagliflozin] Other (See Comments)    Dizzy  . Januvia [Sitagliptin] Other (See Comments)    Gives heartburn and upsets her stomach    Social History   Socioeconomic History  . Marital status: Married    Spouse name: Not on file  . Number of children: 2  . Years of education: Not on file  . Highest education  level: Not on file  Occupational History  . Occupation: LPN  Tobacco Use  . Smoking status: Never Smoker  . Smokeless tobacco: Never Used  Substance and Sexual Activity  . Alcohol use: No  . Drug use: No  . Sexual activity: Yes  Other Topics Concern  . Not on file  Social History Narrative  . Not on file   Social Determinants of Health   Financial Resource Strain: Not on file  Food Insecurity: Not on file  Transportation Needs: Not on file  Physical Activity: Not on file  Stress: Not on file  Social Connections: Not on file  Intimate Partner Violence: Not on file    Family History  Problem Relation Age of Onset  . Diabetes Mother   . Hypertension Mother   . Diabetes Father   . Hypertension Father   . Diabetes Brother   . Hypertension Brother     Past Surgical History:  Procedure Laterality Date  . CATARACT EXTRACTION, BILATERAL     08/21/2020 right, 09/11/2020 left    ROS: Review of Systems Negative except as stated above  PHYSICAL EXAM: BP (!) 160/80   Pulse 72   Resp 16   Wt 126 lb 6.4 oz (57.3 kg)   SpO2 97%   BMI 22.39 kg/m   Wt Readings from Last 3 Encounters:  09/20/20 126 lb 6.4 oz (57.3 kg)  12/08/19 126 lb 12.8 oz (57.5 kg)  02/25/18 133 lb 3.2 oz (60.4 kg)    Physical Exam  General appearance - alert, well appearing, and in no distress Mental status - normal mood, behavior, speech, dress, motor activity, and thought processes Neck - supple, no significant adenopathy.  She has prominent and notable pulsation of the carotid artery on the right side of the neck to the right of the sternal notch Chest - clear to auscultation, no wheezes, rales or rhonchi, symmetric air entry Heart - normal rate, regular rhythm, normal S1, S2, no murmurs, rubs, clicks or gallops Extremities - peripheral pulses normal, no pedal edema, no clubbing or cyanosis   CMP Latest Ref Rng & Units 06/09/2019 02/25/2018 06/29/2017  Glucose 65 - 99 mg/dL 188(H) 165(H) 144(H)   BUN 8 - 27 mg/dL 5(L) 9 7(L)  Creatinine 0.57 - 1.00 mg/dL 0.66 0.60 0.58  Sodium 134 - 144 mmol/L 139 138 142  Potassium 3.5 - 5.2 mmol/L 4.1 3.8 4.1  Chloride 96 - 106 mmol/L 98 97 99  CO2 20 - 29 mmol/L _0 Calcium 8.7 - 10.3 mg/dL 9.9 9.6 10.1  Total Protein 6.0 - 8.5 g/dL 7.1 6.8 -  Total Bilirubin 0.0 - 1.2 mg/dL 0.4 <0.2 -  Alkaline Phos 39 - 117 IU/L 77 77 -  AST 0 - 40 IU/L 21 34 -  ALT 0 - 32 IU/L 15  35(H) -   Lipid Panel     Component Value Date/Time   CHOL 151 06/09/2019 1201   TRIG 151 (H) 06/09/2019 1201   HDL 74 06/09/2019 1201   CHOLHDL 2.0 06/09/2019 1201   LDLCALC 52 06/09/2019 1201    CBC    Component Value Date/Time   WBC 8.5 06/09/2019 1201   RBC 4.82 06/09/2019 1201   HGB 13.1 06/09/2019 1201   HCT 40.8 06/09/2019 1201   PLT 332 06/09/2019 1201   MCV 85 06/09/2019 1201   MCH 27.2 06/09/2019 1201   MCHC 32.1 06/09/2019 1201   RDW 14.1 06/09/2019 1201    ASSESSMENT AND PLAN: 1. Type 2 diabetes mellitus with hyperglycemia, without long-term current use of insulin (HCC) Not at goal. Recommend increase Januvia to 50 mg daily.  Continue metformin and glipizide.  Continue healthy eating habits and regular exercise. - POCT glucose (manual entry) - POCT glycosylated hemoglobin (Hb A1C) - Microalbumin / creatinine urine ratio - sitaGLIPtin (JANUVIA) 50 MG tablet; Take 1 tablet (50 mg total) by mouth daily.  Dispense: 90 tablet; Refill: 2 - glipiZIDE (GLUCOTROL) 10 MG tablet; TAKE 1 TABLET (10 MG TOTAL) BY MOUTH 2 (TWO) TIMES DAILY. PLEASE MAKE PCP APPOINTMENT.  Dispense: 180 tablet; Refill: 2  2. Hypertension associated with diabetes ([place]) Not at goal.  DASH diet encouraged.  Given the option of adding another blood pressure medication versus increasing the lisinopril/hydrochlorothiazide to 20/25 mg daily.  Patient opted for the latter. - CBC - Comprehensive metabolic panel - lisinopril-hydrochlorothiazide (ZESTORETIC) 20-25 MG tablet; Take 1  tablet by mouth daily.  Dispense: 90 tablet; Refill: 3  3. Hyperlipidemia associated with type 2 diabetes mellitus (HCC) - simvastatin (ZOCOR) 20 MG tablet; Take 1 tablet (20 mg total) by mouth daily.  Dispense: 90 tablet; Refill: 2 - Lipid panel  4. Postmenopausal estrogen deficiency - DG Bone Density; Future  5. Vaginal prolapse - Ambulatory referral to Gynecology  6. Tetanus, diphtheria, and acellular pertussis (Tdap) vaccination declined Recommended.  Patient declined.  7. 2019 novel coronavirus vaccination declined Strongly recommended.  Patient declined.  8. Pneumococcal vaccination declined Recommended.  Patient declined.   Patient was given the opportunity to ask questions.  Patient verbalized understanding of the plan and was able to repeat key elements of the plan.   Orders Placed This Encounter  Procedures  . DG Bone Density  . Microalbumin / creatinine urine ratio  . CBC  . Comprehensive metabolic panel  . Lipid panel  . Ambulatory referral to Gynecology  . POCT glucose (manual entry)  . POCT glycosylated hemoglobin (Hb A1C)     Requested Prescriptions   Signed Prescriptions Disp Refills  . sitaGLIPtin (JANUVIA) 50 MG tablet 90 tablet 2    Sig: Take 1 tablet (50 mg total) by mouth daily.  Marland Kitchen glipiZIDE (GLUCOTROL) 10 MG tablet 180 tablet 2    Sig: TAKE 1 TABLET (10 MG TOTAL) BY MOUTH 2 (TWO) TIMES DAILY. PLEASE MAKE PCP APPOINTMENT.  Marland Kitchen simvastatin (ZOCOR) 20 MG tablet 90 tablet 2    Sig: Take 1 tablet (20 mg total) by mouth daily.  Marland Kitchen lisinopril-hydrochlorothiazide (ZESTORETIC) 20-25 MG tablet 90 tablet 3    Sig: Take 1 tablet by mouth daily.    Return in about 5 months (around 02/20/2021) for Give appt with Lurena Joiner in 1 mth for Medicare Wellness visit.  Karle Plumber, MD, FACP

## 2020-09-20 NOTE — Patient Instructions (Signed)
Your blood pressure is not at goal.  We have increased the lisinopril/hydrochlorothiazide to 20 mg / 25 mg daily.  Try to check your blood pressure at least once a week.  The goal is 130/80 or lower.  Your diabetes is not controlled.  We have increased the Januvia to 50 mg once a day.

## 2020-09-21 ENCOUNTER — Other Ambulatory Visit: Payer: Self-pay | Admitting: Internal Medicine

## 2020-09-21 DIAGNOSIS — E1165 Type 2 diabetes mellitus with hyperglycemia: Secondary | ICD-10-CM

## 2020-09-21 LAB — CBC
Hematocrit: 38.6 % (ref 34.0–46.6)
Hemoglobin: 12.6 g/dL (ref 11.1–15.9)
MCH: 26.9 pg (ref 26.6–33.0)
MCHC: 32.6 g/dL (ref 31.5–35.7)
MCV: 83 fL (ref 79–97)
Platelets: 341 10*3/uL (ref 150–450)
RBC: 4.68 x10E6/uL (ref 3.77–5.28)
RDW: 14 % (ref 11.7–15.4)
WBC: 8.9 10*3/uL (ref 3.4–10.8)

## 2020-09-21 LAB — MICROALBUMIN / CREATININE URINE RATIO
Creatinine, Urine: 145.4 mg/dL
Microalb/Creat Ratio: 13 mg/g creat (ref 0–29)
Microalbumin, Urine: 18.8 ug/mL

## 2020-09-21 LAB — LIPID PANEL
Chol/HDL Ratio: 2.4 ratio (ref 0.0–4.4)
Cholesterol, Total: 145 mg/dL (ref 100–199)
HDL: 61 mg/dL (ref >39)
LDL Chol Calc (NIH): 51 mg/dL (ref 0–99)
Triglycerides: 209 mg/dL — ABNORMAL HIGH (ref 0–149)
VLDL Cholesterol Cal: 33 mg/dL (ref 5–40)

## 2020-09-21 LAB — COMPREHENSIVE METABOLIC PANEL
ALT: 16 IU/L (ref 0–32)
AST: 19 IU/L (ref 0–40)
Albumin/Globulin Ratio: 1.5 (ref 1.2–2.2)
Albumin: 4.4 g/dL (ref 3.8–4.8)
Alkaline Phosphatase: 62 IU/L (ref 44–121)
BUN/Creatinine Ratio: 11 — ABNORMAL LOW (ref 12–28)
BUN: 6 mg/dL — ABNORMAL LOW (ref 8–27)
Bilirubin Total: 0.3 mg/dL (ref 0.0–1.2)
CO2: 28 mmol/L (ref 20–29)
Calcium: 10.1 mg/dL (ref 8.7–10.3)
Chloride: 97 mmol/L (ref 96–106)
Creatinine, Ser: 0.57 mg/dL (ref 0.57–1.00)
Globulin, Total: 3 g/dL (ref 1.5–4.5)
Glucose: 151 mg/dL — ABNORMAL HIGH (ref 65–99)
Potassium: 4.2 mmol/L (ref 3.5–5.2)
Sodium: 141 mmol/L (ref 134–144)
Total Protein: 7.4 g/dL (ref 6.0–8.5)
eGFR: 99 mL/min/{1.73_m2} (ref >59)

## 2020-09-21 NOTE — Telephone Encounter (Signed)
Request form local pharmacy rather than mail order Requested Prescriptions  Pending Prescriptions Disp Refills  . glipiZIDE (GLUCOTROL) 10 MG tablet [Pharmacy Med Name: GLIPIZIDE 10 MG TABLET] 180 tablet 2    Sig: TAKE 1 TABLET BY MOUTH TWICE A DAY     Endocrinology:  Diabetes - Sulfonylureas Failed - 09/21/2020  9:25 AM      Failed - HBA1C is between 0 and 7.9 and within 180 days    HbA1c, POC (controlled diabetic range)  Date Value Ref Range Status  09/20/2020 8.3 (A) 0.0 - 7.0 % Final         Passed - Valid encounter within last 6 months    Recent Outpatient Visits          Yesterday Type 2 diabetes mellitus with hyperglycemia, without long-term current use of insulin (HCC)   [place] [facility] [place], Gavin Pound B, MD   9 months ago Type 2 diabetes mellitus with hyperglycemia, without long-term current use of insulin [facility])   [place] [facility] Jonah Blue B, MD   1 year ago Type 2 diabetes mellitus without complication, without long-term current use of insulin (HCC)   [place] [facility] Marcine Matar, MD   1 year ago Hyperlipidemia, unspecified hyperlipidemia type   [facility] Marcine Matar, MD   1 year ago Diabetes mellitus type 2, uncontrolled, without complications [facility])   [place] [facility] Marcine Matar, MD

## 2020-09-21 NOTE — Progress Notes (Signed)
Patient notified her blood cell counts are normal.  Kidney and liver function tests normal.  LDL cholesterol at goal.  Mild elevation in triglyceride level.  Healthy eating habits and regular exercise will help to lower triglyceride levels.

## 2020-09-23 ENCOUNTER — Telehealth: Payer: Self-pay

## 2020-09-23 NOTE — Telephone Encounter (Signed)
Contacted pt to go over lab results pt is aware and doesn't have any questions or concerns 

## 2020-10-14 ENCOUNTER — Other Ambulatory Visit: Payer: Self-pay | Admitting: Internal Medicine

## 2020-10-14 DIAGNOSIS — E1165 Type 2 diabetes mellitus with hyperglycemia: Secondary | ICD-10-CM

## 2020-10-14 NOTE — Telephone Encounter (Signed)
   Notes to clinic: Patient is taking medication 2 times a day but Otho Darner is on 42 tabs  Review for refill   Requested Prescriptions  Pending Prescriptions Disp Refills   metFORMIN (GLUCOPHAGE) 1000 MG tablet [Pharmacy Med Name: METFORMIN HCL 1,000 MG TABLET] 42 tablet 0    Sig: TAKE 1 TABLET (1,000 MG TOTAL) BY MOUTH 2 TIMES DAILY WITH A MEAL. (NEED OFFICE VISIT)      Endocrinology:  Diabetes - Biguanides Failed - 10/14/2020  1:45 AM      Failed - HBA1C is between 0 and 7.9 and within 180 days    HbA1c, POC (controlled diabetic range)  Date Value Ref Range Status  09/20/2020 8.3 (A) 0.0 - 7.0 % Final          Passed - Cr in normal range and within 360 days    Creatinine, Ser  Date Value Ref Range Status  09/20/2020 0.57 0.57 - 1.00 mg/dL Final          Passed - eGFR in normal range and within 360 days    GFR calc Af Amer  Date Value Ref Range Status  06/09/2019 106 >59 mL/min/1.73 Final   GFR calc non Af Amer  Date Value Ref Range Status  06/09/2019 92 >59 mL/min/1.73 Final   eGFR  Date Value Ref Range Status  09/20/2020 99 >59 mL/min/1.73 Final          Passed - Valid encounter within last 6 months    Recent Outpatient Visits           3 weeks ago Type 2 diabetes mellitus with hyperglycemia, without long-term current use of insulin ([place])   [place], Neoma Laming B, MD   10 months ago Type 2 diabetes mellitus with hyperglycemia, without long-term current use of insulin ([place])   [place] Karle Plumber B, MD   1 year ago Type 2 diabetes mellitus without complication, without long-term current use of insulin ([place])   [place] Ladell Pier, MD   1 year ago Hyperlipidemia, unspecified hyperlipidemia type   [place], Deborah B, MD   1 year ago Diabetes mellitus type 2, uncontrolled, without complications [facility])   [place]  [facility] And [facility]ness Ladell Pier, MD

## 2020-10-15 DIAGNOSIS — H524 Presbyopia: Secondary | ICD-10-CM | POA: Diagnosis not present

## 2020-10-15 DIAGNOSIS — Z0100 Encounter for examination of eyes and vision without abnormal findings: Secondary | ICD-10-CM | POA: Diagnosis not present

## 2020-10-21 ENCOUNTER — Other Ambulatory Visit: Payer: Self-pay | Admitting: Internal Medicine

## 2020-10-21 MED ORDER — TRUE METRIX METER W/DEVICE KIT: PACK | 0 refills | Status: DC

## 2020-10-21 NOTE — Telephone Encounter (Signed)
Copied from CRM [id number]. Topic: Quick Communication - Rx Refill/Question >> Oct 21, 2020  1:17 PM Gaetana Michaelis A wrote: Medication: Blood Glucose Monitoring Suppl (TRUE METRIX METER) test strips  Has the patient contacted their pharmacy? Yes. The patient has contacted their pharmacy and been directed to contact their PCP  Preferred Pharmacy (with phone number or street name): [facility] - [place], [place] - [address]  Phone:  [phone] Fax:  [phone]  Agent: Please be advised that RX refills may take up to 3 business days. We ask that you follow-up with your pharmacy.

## 2020-10-24 ENCOUNTER — Other Ambulatory Visit: Payer: Self-pay | Admitting: Internal Medicine

## 2020-10-24 DIAGNOSIS — E785 Hyperlipidemia, unspecified: Secondary | ICD-10-CM

## 2020-10-24 DIAGNOSIS — E1169 Type 2 diabetes mellitus with other specified complication: Secondary | ICD-10-CM

## 2020-10-24 DIAGNOSIS — I10 Essential (primary) hypertension: Secondary | ICD-10-CM

## 2020-10-30 ENCOUNTER — Other Ambulatory Visit: Payer: Self-pay | Admitting: Internal Medicine

## 2020-10-30 ENCOUNTER — Other Ambulatory Visit: Payer: Self-pay

## 2020-10-30 MED ORDER — TRUEPLUS LANCETS 28G MISC
2 refills | Status: DC
  Filled 2020-10-30: qty 100, 50d supply, fill #0

## 2020-10-30 NOTE — Telephone Encounter (Signed)
Copied from CRM [id number]. Topic: Quick Communication - Rx Refill/Question >> Oct 30, 2020  2:29 PM Jaquita Rector A wrote: Medication: TRUEplus Lancets 33G MISC, True Metrix Level Control Solution Level 1, TRUE METRIX Blood Glucose Test Strips, BD Alcohol Swabs  Has the patient contacted their pharmacy? Yes.   (Agent: If no, request that the patient contact the pharmacy for the refill.) (Agent: If yes, when and what did the pharmacy advise?)  Preferred Pharmacy (with phone number or street name): [facility] (Now [facility]) - [place], [place] - [address]  Phone:  [phone] Fax:  [phone]     Agent: Please be advised that RX refills may take up to 3 business days. We ask that you follow-up with your pharmacy.

## 2020-10-30 NOTE — Telephone Encounter (Signed)
   Notes to clinic:  Patient also requesting True Metrix Level Control Solution Level 1, TRUE METRIX Blood Glucose Test Strips, BD Alcohol Swabs     Requested Prescriptions  Pending Prescriptions Disp Refills   TRUEplus Lancets 28G MISC 100 each 0    Sig: Must keep upcoming office visit for refills      Endocrinology: Diabetes - Testing Supplies Passed - 10/30/2020  2:39 PM      Passed - Valid encounter within last 12 months    Recent Outpatient Visits           1 month ago Type 2 diabetes mellitus with hyperglycemia, without long-term current use of insulin (HCC)   [place] [facility] [place], Gavin Pound B, MD   10 months ago Type 2 diabetes mellitus with hyperglycemia, without long-term current use of insulin [facility])   [place] [facility] Jonah Blue B, MD   1 year ago Type 2 diabetes mellitus without complication, without long-term current use of insulin (HCC)   [place] [facility] Marcine Matar, MD   1 year ago Hyperlipidemia, unspecified hyperlipidemia type   [facility] Marcine Matar, MD   1 year ago Diabetes mellitus type 2, uncontrolled, without complications [employer] [facility])   [place] [facility] Marcine Matar, MD

## 2020-11-06 ENCOUNTER — Other Ambulatory Visit: Payer: Self-pay

## 2020-11-07 ENCOUNTER — Other Ambulatory Visit: Payer: Self-pay

## 2020-11-07 ENCOUNTER — Ambulatory Visit
Admission: RE | Admit: 2020-11-07 | Discharge: 2020-11-07 | Disposition: A | Discharge status: Home / Self Care | Payer: Medicare HMO | Source: Ambulatory Visit | Attending: Internal Medicine | Admitting: Internal Medicine

## 2020-11-07 DIAGNOSIS — M85852 Other specified disorders of bone density and structure, left thigh: Secondary | ICD-10-CM | POA: Diagnosis not present

## 2020-11-07 DIAGNOSIS — M810 Age-related osteoporosis without current pathological fracture: Secondary | ICD-10-CM | POA: Diagnosis not present

## 2020-11-07 DIAGNOSIS — Z780 Asymptomatic menopausal state: Secondary | ICD-10-CM

## 2020-11-08 ENCOUNTER — Other Ambulatory Visit: Payer: Self-pay | Admitting: Internal Medicine

## 2020-11-08 DIAGNOSIS — Z780 Asymptomatic menopausal state: Secondary | ICD-10-CM

## 2020-11-09 ENCOUNTER — Telehealth: Payer: Self-pay | Admitting: Internal Medicine

## 2020-11-09 NOTE — Telephone Encounter (Signed)
Phone call placed to pt today to discuss bone density results. I left message on VM stating who I am and that I was calling to discuss bone density results.  Will try to reach her again next wk.

## 2020-11-13 ENCOUNTER — Telehealth: Payer: Self-pay | Admitting: Internal Medicine

## 2020-11-13 NOTE — Telephone Encounter (Signed)
Pt called back to go over her bone density test, please advise, pt stated she will not understand the letter that has been mailed.

## 2020-11-13 NOTE — Telephone Encounter (Signed)
Letter has been mailed.

## 2020-11-13 NOTE — Telephone Encounter (Signed)
I tried to reach patient again today via phone to go over the results of her bone density study.  I got her voicemail stating that this person is not available.  I left a message stating who I am and that I was calling to go over results of the test.  Asked that she give [place] a call back at [phone].  If patient calls back, please let her know that her bone density study reveals that she has osteoporosis which is severe thinning of the bones.  This increases her risk for fractures at the hips and spinal bones.  I recommend starting a medication called Fosamax 70 mg once a week to help strengthen her bones.  The medication has to be taken once a week first thing in the mornings by itself with a big glass of water.  She should not eat anything or lie down for at least 30 minutes after taking the medication.  If she develops any acid reflux symptoms while she is on the medication, she should let me know.  I also advised taking calcium 600 mg twice a day and vitamin D 400 IU once a day.  These can both be purchased over-the-counter.  Let me know if she is agreeable to starting the Fosamax and I can send a prescription to her pharmacy.  Alternatively, instead of Fosamax, she can be placed on a medication called Prolia which is an injection given twice a year to treat osteoporosis.  If she would like to go that route, I can refer her to an endocrinologist for that.  I will also send patient a letter in the event that she does not call back.

## 2020-11-15 NOTE — Telephone Encounter (Signed)
Called pt unable to reach at [phone]

## 2020-11-21 ENCOUNTER — Telehealth: Payer: Self-pay | Admitting: Internal Medicine

## 2020-11-21 MED ORDER — CALCIUM CARBONATE 600 MG PO TABS: 600.0000 mg | ORAL_TABLET | Freq: Two times a day (BID) | ORAL | 3 refills | Status: AC

## 2020-11-21 MED ORDER — VITAMIN D (CHOLECALCIFEROL) 10 MCG (400 UNIT) PO TABS: 10.0000 ug | ORAL_TABLET | Freq: Every day | ORAL | 2 refills | Status: AC

## 2020-11-21 MED ORDER — ALENDRONATE SODIUM 70 MG PO TABS: 70.0000 mg | ORAL_TABLET | ORAL | 2 refills | Status: DC

## 2020-11-21 NOTE — Telephone Encounter (Signed)
Copied from CRM [id number]. Topic: General - Other >> Nov 18, 2020  2:27 PM Jaquita Rector A wrote: Reason for CRM: Patiernt would like to know if Dr Laural Benes can please send an Rx to the pharmacy for her Vitamin D and Calcium that was recommended in the letter received. Please advise Ph# [phone]

## 2020-11-21 NOTE — Telephone Encounter (Signed)
Will forward to provider  

## 2020-11-22 ENCOUNTER — Telehealth: Payer: Self-pay | Admitting: Internal Medicine

## 2020-11-22 NOTE — Telephone Encounter (Signed)
Returned pt call and made aware of rxs being sent to the pharmacy pt doesn't have any questions or concerns

## 2020-11-22 NOTE — Telephone Encounter (Signed)
Copied from CRM [id number]. Topic: General - Other >> Nov 22, 2020  3:12 PM Marylen Ponto wrote: Reason for CRM: Pt returned call to the office. Attempted to transfer pt but there was no answer. Pt requests call back

## 2020-11-22 NOTE — Telephone Encounter (Signed)
Returned pt call pt didn't answer lvm asking pt to give [place] a call back

## 2020-12-11 ENCOUNTER — Telehealth: Payer: Self-pay

## 2020-12-11 NOTE — Telephone Encounter (Signed)
LVM for pt to call back as soon as possible.   RE: scheduling for mobile mammo event in August or September.  

## 2020-12-20 ENCOUNTER — Ambulatory Visit: Payer: Medicare HMO

## 2021-01-14 ENCOUNTER — Other Ambulatory Visit: Payer: Self-pay | Admitting: Internal Medicine

## 2021-01-14 DIAGNOSIS — E1165 Type 2 diabetes mellitus with hyperglycemia: Secondary | ICD-10-CM

## 2021-01-15 DIAGNOSIS — Z961 Presence of intraocular lens: Secondary | ICD-10-CM | POA: Diagnosis not present

## 2021-01-19 ENCOUNTER — Telehealth: Payer: Self-pay

## 2021-01-19 NOTE — Telephone Encounter (Signed)
Tried to call pt to get AWV scheduled. No answer. No VM.

## 2021-01-30 ENCOUNTER — Other Ambulatory Visit: Payer: Self-pay | Admitting: Internal Medicine

## 2021-01-30 DIAGNOSIS — E1169 Type 2 diabetes mellitus with other specified complication: Secondary | ICD-10-CM

## 2021-01-30 DIAGNOSIS — E785 Hyperlipidemia, unspecified: Secondary | ICD-10-CM

## 2021-02-10 ENCOUNTER — Encounter: Payer: Medicare HMO | Admitting: Obstetrics and Gynecology

## 2021-04-17 ENCOUNTER — Other Ambulatory Visit: Payer: Self-pay | Admitting: Internal Medicine

## 2021-04-17 DIAGNOSIS — E1165 Type 2 diabetes mellitus with hyperglycemia: Secondary | ICD-10-CM

## 2021-04-18 ENCOUNTER — Other Ambulatory Visit: Payer: Self-pay | Admitting: Internal Medicine

## 2021-04-18 DIAGNOSIS — E1165 Type 2 diabetes mellitus with hyperglycemia: Secondary | ICD-10-CM

## 2021-04-18 NOTE — Telephone Encounter (Signed)
Requested medication (s) are due for refill today: yes  Requested medication (s) are on the active medication list: no  Last refill:  08/08/20  Future visit scheduled: no  Notes to clinic:  labs not up to date and pt needs appt. Please advise     Requested Prescriptions  Pending Prescriptions Disp Refills   JANUVIA 25 MG tablet [Pharmacy Med Name: JANUVIA 25 MG TABLET] 30 tablet 6    Sig: TAKE 1 TABLET EVERY DAY     Endocrinology:  Diabetes - DPP-4 Inhibitors Failed - 04/18/2021  4:13 PM      Failed - HBA1C is between 0 and 7.9 and within 180 days    HbA1c, POC (controlled diabetic range)  Date Value Ref Range Status  09/20/2020 8.3 (A) 0.0 - 7.0 % Final          Failed - Valid encounter within last 6 months    Recent Outpatient Visits           7 months ago Type 2 diabetes mellitus with hyperglycemia, without long-term current use of insulin (HCC)   [place] [facility] [place], Gavin Pound B, MD   1 year ago Type 2 diabetes mellitus with hyperglycemia, without long-term current use of insulin (HCC)   [place] [facility] Jonah Blue B, MD   1 year ago Type 2 diabetes mellitus without complication, without long-term current use of insulin (HCC)   [place] [facility] Marcine Matar, MD   1 year ago Hyperlipidemia, unspecified hyperlipidemia type   [facility] Jonah Blue B, MD   2 years ago Diabetes mellitus type 2, uncontrolled, without complications [facility])   [place] [facility] Marcine Matar, MD              Passed - Cr in normal range and within 360 days    Creatinine, Ser  Date Value Ref Range Status  09/20/2020 0.57 0.57 - 1.00 mg/dL Final

## 2021-04-30 ENCOUNTER — Other Ambulatory Visit: Payer: Self-pay | Admitting: Internal Medicine

## 2021-04-30 DIAGNOSIS — E785 Hyperlipidemia, unspecified: Secondary | ICD-10-CM

## 2021-04-30 DIAGNOSIS — E1165 Type 2 diabetes mellitus with hyperglycemia: Secondary | ICD-10-CM

## 2021-04-30 NOTE — Telephone Encounter (Signed)
Requested Prescriptions  Pending Prescriptions Disp Refills   simvastatin (ZOCOR) 20 MG tablet [Pharmacy Med Name: SIMVASTATIN 20 MG Tablet] 90 tablet     Sig: TAKE 1 TABLET (20 MG TOTAL) BY MOUTH DAILY. MUST KEEP UPCOMING OFFICE VISIT FOR REFILLS     Cardiovascular:  Antilipid - Statins Failed - 04/30/2021  3:34 AM      Failed - Triglycerides in normal range and within 360 days    Triglycerides  Date Value Ref Range Status  09/20/2020 209 (H) 0 - 149 mg/dL Final         Passed - Total Cholesterol in normal range and within 360 days    Cholesterol, Total  Date Value Ref Range Status  09/20/2020 145 100 - 199 mg/dL Final         Passed - LDL in normal range and within 360 days    LDL Chol Calc (NIH)  Date Value Ref Range Status  09/20/2020 51 0 - 99 mg/dL Final         Passed - HDL in normal range and within 360 days    HDL  Date Value Ref Range Status  09/20/2020 61 >39 mg/dL Final         Passed - Patient is not pregnant      Passed - Valid encounter within last 12 months    Recent Outpatient Visits          7 months ago Type 2 diabetes mellitus with hyperglycemia, without long-term current use of insulin (HCC)   [place] [facility] [place], Gavin Pound B, MD   1 year ago Type 2 diabetes mellitus with hyperglycemia, without long-term current use of insulin (HCC)   [place] [facility] Jonah Blue B, MD   1 year ago Type 2 diabetes mellitus without complication, without long-term current use of insulin (HCC)   [place] [facility] Marcine Matar, MD   1 year ago Hyperlipidemia, unspecified hyperlipidemia type   [facility] Marcine Matar, MD   2 years ago Diabetes mellitus type 2, uncontrolled, without complications (HCC)   [place] [facility] [place], Binnie Rail, MD              JANUVIA 50 MG tablet [Pharmacy Med Name: JANUVIA 50 MG Tablet]  90 tablet 2    Sig: TAKE 1 TABLET EVERY DAY     Endocrinology:  Diabetes - DPP-4 Inhibitors Failed - 04/30/2021  3:34 AM      Failed - HBA1C is between 0 and 7.9 and within 180 days    HbA1c, POC (controlled diabetic range)  Date Value Ref Range Status  09/20/2020 8.3 (A) 0.0 - 7.0 % Final         Failed - Valid encounter within last 6 months    Recent Outpatient Visits          7 months ago Type 2 diabetes mellitus with hyperglycemia, without long-term current use of insulin (HCC)   [place] [facility] [place], Gavin Pound B, MD   1 year ago Type 2 diabetes mellitus with hyperglycemia, without long-term current use of insulin [facility])   [place] [facility] Jonah Blue B, MD   1 year ago Type 2 diabetes mellitus without complication, without long-term current use of insulin [facility])    [facility] Marcine Matar, MD   1 year ago Hyperlipidemia, unspecified hyperlipidemia  type   [facility] Jonah Blue B, MD   2 years ago Diabetes mellitus type 2, uncontrolled, without complications [facility])   [facility] Marcine Matar, MD             Passed - Cr in normal range and within 360 days    Creatinine, Ser  Date Value Ref Range Status  09/20/2020 0.57 0.57 - 1.00 mg/dL Final          lisinopril-hydrochlorothiazide (ZESTORETIC) 20-12.5 MG tablet [Pharmacy Med Name: LISINOPRIL/HYDROCHLOROTHIAZIDE 20-12.5 MG Tablet] 90 tablet     Sig: TAKE 1 TABLET BY MOUTH DAILY. MUST KEEP UPCOMING OFFICE VISIT FOR REFILLS     Cardiovascular:  ACEI + Diuretic Combos Failed - 04/30/2021  3:34 AM      Failed - Na in normal range and within 180 days    Sodium  Date Value Ref Range Status  09/20/2020 141 134 - 144 mmol/L Final         Failed - K in normal range and within 180 days    Potassium  Date Value Ref Range Status  09/20/2020 4.2 3.5 - 5.2 mmol/L  Final         Failed - Cr in normal range and within 180 days    Creatinine, Ser  Date Value Ref Range Status  09/20/2020 0.57 0.57 - 1.00 mg/dL Final         Failed - Ca in normal range and within 180 days    Calcium  Date Value Ref Range Status  09/20/2020 10.1 8.7 - 10.3 mg/dL Final         Failed - Last BP in normal range    BP Readings from Last 1 Encounters:  09/20/20 (!) 160/80         Failed - Valid encounter within last 6 months    Recent Outpatient Visits          7 months ago Type 2 diabetes mellitus with hyperglycemia, without long-term current use of insulin (HCC)   [place] [facility] [place], Gavin Pound B, MD   1 year ago Type 2 diabetes mellitus with hyperglycemia, without long-term current use of insulin (HCC)   [place] [facility] Jonah Blue B, MD   1 year ago Type 2 diabetes mellitus without complication, without long-term current use of insulin (HCC)   [place] [facility] Marcine Matar, MD   1 year ago Hyperlipidemia, unspecified hyperlipidemia type   [facility] Marcine Matar, MD   2 years ago Diabetes mellitus type 2, uncontrolled, without complications [facility])   [place] [facility]ness Marcine Matar, MD             Passed - Patient is not pregnant

## 2021-04-30 NOTE — Telephone Encounter (Signed)
Requested medications are due for refill today.  no  Requested medications are on the active medications list.  no  Last refill. 08/01/2020  Future visit scheduled.   no  Notes to clinic.  Dosage was changed on 09/20/2020.    Requested Prescriptions  Pending Prescriptions Disp Refills   lisinopril-hydrochlorothiazide (ZESTORETIC) 20-12.5 MG tablet [Pharmacy Med Name: LISINOPRIL/HYDROCHLOROTHIAZIDE 20-12.5 MG Tablet] 90 tablet     Sig: TAKE 1 TABLET BY MOUTH DAILY. MUST KEEP UPCOMING OFFICE VISIT FOR REFILLS     Cardiovascular:  ACEI + Diuretic Combos Failed - 04/30/2021  3:34 AM      Failed - Na in normal range and within 180 days    Sodium  Date Value Ref Range Status  09/20/2020 141 134 - 144 mmol/L Final          Failed - K in normal range and within 180 days    Potassium  Date Value Ref Range Status  09/20/2020 4.2 3.5 - 5.2 mmol/L Final          Failed - Cr in normal range and within 180 days    Creatinine, Ser  Date Value Ref Range Status  09/20/2020 0.57 0.57 - 1.00 mg/dL Final          Failed - Ca in normal range and within 180 days    Calcium  Date Value Ref Range Status  09/20/2020 10.1 8.7 - 10.3 mg/dL Final          Failed - Last BP in normal range    BP Readings from Last 1 Encounters:  09/20/20 (!) 160/80          Failed - Valid encounter within last 6 months    Recent Outpatient Visits           7 months ago Type 2 diabetes mellitus with hyperglycemia, without long-term current use of insulin (HCC)   [place] [facility] [place], Gavin Pound B, MD   1 year ago Type 2 diabetes mellitus with hyperglycemia, without long-term current use of insulin (HCC)   [place] [facility] Jonah Blue B, MD   1 year ago Type 2 diabetes mellitus without complication, without long-term current use of insulin (HCC)   [place] [facility] Marcine Matar, MD   1 year ago Hyperlipidemia,  unspecified hyperlipidemia type   [facility] Jonah Blue B, MD   2 years ago Diabetes mellitus type 2, uncontrolled, without complications [facility])    [facility] Marcine Matar, MD              Passed - Patient is not pregnant      Signed Prescriptions Disp Refills   simvastatin (ZOCOR) 20 MG tablet 90 tablet 0    Sig: TAKE 1 TABLET (20 MG TOTAL) BY MOUTH DAILY. MUST KEEP UPCOMING OFFICE VISIT FOR REFILLS     Cardiovascular:  Antilipid - Statins Failed - 04/30/2021  3:34 AM      Failed - Triglycerides in normal range and within 360 days    Triglycerides  Date Value Ref Range Status  09/20/2020 209 (H) 0 - 149 mg/dL Final          Passed - Total Cholesterol in normal range and within 360 days    Cholesterol, Total  Date Value Ref Range Status  09/20/2020 145 100 - 199 mg/dL Final          Passed -  LDL in normal range and within 360 days    LDL Chol Calc (NIH)  Date Value Ref Range Status  09/20/2020 51 0 - 99 mg/dL Final          Passed - HDL in normal range and within 360 days    HDL  Date Value Ref Range Status  09/20/2020 61 >39 mg/dL Final          Passed - Patient is not pregnant      Passed - Valid encounter within last 12 months    Recent Outpatient Visits           7 months ago Type 2 diabetes mellitus with hyperglycemia, without long-term current use of insulin (HCC)   [place] [facility] [place], Gavin Pound B, MD   1 year ago Type 2 diabetes mellitus with hyperglycemia, without long-term current use of insulin (HCC)   [place] [facility] Marcine Matar, MD   1 year ago Type 2 diabetes mellitus without complication, without long-term current use of insulin (HCC)   [place] [facility] Marcine Matar, MD   1 year ago Hyperlipidemia, unspecified hyperlipidemia type   [facility] Marcine Matar, MD   2 years ago Diabetes mellitus type 2, uncontrolled, without complications (HCC)   [place] [facility] [place], Binnie Rail, MD               JANUVIA 50 MG tablet 30 tablet 0    Sig: TAKE 1 TABLET EVERY DAY     Endocrinology:  Diabetes - DPP-4 Inhibitors Failed - 04/30/2021  3:34 AM      Failed - HBA1C is between 0 and 7.9 and within 180 days    HbA1c, POC (controlled diabetic range)  Date Value Ref Range Status  09/20/2020 8.3 (A) 0.0 - 7.0 % Final          Failed - Valid encounter within last 6 months    Recent Outpatient Visits           7 months ago Type 2 diabetes mellitus with hyperglycemia, without long-term current use of insulin (HCC)   [place] [facility] [place], Gavin Pound B, MD   1 year ago Type 2 diabetes mellitus with hyperglycemia, without long-term current use of insulin (HCC)   [place] [facility] Jonah Blue B, MD   1 year ago Type 2 diabetes mellitus without complication, without long-term current use of insulin (HCC)    [facility] Marcine Matar, MD   1 year ago Hyperlipidemia, unspecified hyperlipidemia type   [facility] Jonah Blue B, MD   2 years ago Diabetes mellitus type 2, uncontrolled, without complications [facility])    [facility] Marcine Matar, MD              Passed - Cr in normal range and within 360 days    Creatinine, Ser  Date Value Ref Range Status  09/20/2020 0.57 0.57 - 1.00 mg/dL Final

## 2021-05-01 ENCOUNTER — Other Ambulatory Visit: Payer: Self-pay

## 2021-05-01 ENCOUNTER — Other Ambulatory Visit: Payer: Self-pay | Admitting: Internal Medicine

## 2021-05-01 DIAGNOSIS — E1165 Type 2 diabetes mellitus with hyperglycemia: Secondary | ICD-10-CM

## 2021-05-01 MED ORDER — METFORMIN HCL 1000 MG PO TABS
ORAL_TABLET | ORAL | 0 refills | Status: DC
  Filled 2021-05-01: qty 60, 30d supply, fill #0

## 2021-05-01 NOTE — Telephone Encounter (Signed)
We received a refill request for the metformin 1,000 mg.   I called and made her an appt with Dr. Laural Benes for 06/10/2020 at 11:10.  I gave her a courtesy 90 day refill.

## 2021-05-27 ENCOUNTER — Telehealth: Payer: Self-pay | Admitting: Internal Medicine

## 2021-05-27 DIAGNOSIS — E1165 Type 2 diabetes mellitus with hyperglycemia: Secondary | ICD-10-CM

## 2021-05-27 NOTE — Telephone Encounter (Signed)
Requested medication (s) are due for refill today: yes  Requested medication (s) are on the active medication list: yes  Last refill:  05/01/21 #60  Future visit scheduled: yes in 2 weeks  Notes to clinic:  Per previous fill "last courtesy RF" overdue  HgbA1C   Requested Prescriptions  Pending Prescriptions Disp Refills   metFORMIN (GLUCOPHAGE) 1000 MG tablet [Pharmacy Med Name: METFORMIN HCL 1,000 MG TABLET] 60 tablet 0    Sig: TAKE 1 TABLET (1,000 MG TOTAL) BY MOUTH 2 TIMES DAILY WITH A MEAL. (NEED OFFICE VISIT)     Endocrinology:  Diabetes - Biguanides Failed - 05/27/2021  9:33 AM      Failed - HBA1C is between 0 and 7.9 and within 180 days    HbA1c, POC (controlled diabetic range)  Date Value Ref Range Status  09/20/2020 8.3 (A) 0.0 - 7.0 % Final          Failed - Valid encounter within last 6 months    Recent Outpatient Visits           8 months ago Type 2 diabetes mellitus with hyperglycemia, without long-term current use of insulin ([place])   [place], Neoma Laming B, MD   1 year ago Type 2 diabetes mellitus with hyperglycemia, without long-term current use of insulin ([place])   [place], Deborah B, MD   1 year ago Type 2 diabetes mellitus without complication, without long-term current use of insulin ([place])   [place], Deborah B, MD   1 year ago Hyperlipidemia, unspecified hyperlipidemia type   [place], Deborah B, MD   2 years ago Diabetes mellitus type 2, uncontrolled, without complications [facility])   [place], MD       Future Appointments             In 2 weeks Ladell Pier, MD [place] in normal range and within 360 days    Creatinine, Ser  Date Value Ref Range Status  09/20/2020 0.57 0.57 -  1.00 mg/dL Final          Passed - eGFR in normal range and within 360 days    GFR calc Af Amer  Date Value Ref Range Status  06/09/2019 106 >59 mL/min/1.73 Final   GFR calc non Af Amer  Date Value Ref Range Status  06/09/2019 92 >59 mL/min/1.73 Final   eGFR  Date Value Ref Range Status  09/20/2020 99 >59 mL/min/1.73 Final

## 2021-05-28 ENCOUNTER — Other Ambulatory Visit: Payer: Self-pay | Admitting: Internal Medicine

## 2021-05-28 DIAGNOSIS — E785 Hyperlipidemia, unspecified: Secondary | ICD-10-CM

## 2021-05-28 DIAGNOSIS — E1169 Type 2 diabetes mellitus with other specified complication: Secondary | ICD-10-CM

## 2021-05-28 NOTE — Telephone Encounter (Signed)
Rx- 04/30/21 #90- too soon Requested Prescriptions  Pending Prescriptions Disp Refills   simvastatin (ZOCOR) 20 MG tablet [Pharmacy Med Name: SIMVASTATIN 20 MG Tablet] 90 tablet 0    Sig: TAKE 1 TABLET EVERY DAY     Cardiovascular:  Antilipid - Statins Failed - 05/28/2021  8:42 AM      Failed - Triglycerides in normal range and within 360 days    Triglycerides  Date Value Ref Range Status  09/20/2020 209 (H) 0 - 149 mg/dL Final         Passed - Total Cholesterol in normal range and within 360 days    Cholesterol, Total  Date Value Ref Range Status  09/20/2020 145 100 - 199 mg/dL Final         Passed - LDL in normal range and within 360 days    LDL Chol Calc (NIH)  Date Value Ref Range Status  09/20/2020 51 0 - 99 mg/dL Final         Passed - HDL in normal range and within 360 days    HDL  Date Value Ref Range Status  09/20/2020 61 >39 mg/dL Final         Passed - Patient is not pregnant      Passed - Valid encounter within last 12 months    Recent Outpatient Visits          8 months ago Type 2 diabetes mellitus with hyperglycemia, without long-term current use of insulin (HCC)   [place] [facility] [place], Gavin Pound B, MD   1 year ago Type 2 diabetes mellitus with hyperglycemia, without long-term current use of insulin (HCC)   [place] [facility] [place], Gavin Pound B, MD   1 year ago Type 2 diabetes mellitus without complication, without long-term current use of insulin (HCC)   [place] [facility] Marcine Matar, MD   1 year ago Hyperlipidemia, unspecified hyperlipidemia type   [facility] Jonah Blue B, MD   2 years ago Diabetes mellitus type 2, uncontrolled, without complications [facility])   [place] [facility] Marcine Matar, MD      Future Appointments            In 1 week Marcine Matar, MD [facility]

## 2021-06-04 NOTE — Telephone Encounter (Signed)
Pt calling back to follow up on her medication stated she is completely out. Pt rescheduled appointment for 08/01/2021. [place].   [id number] [place], [place] - [address]  [address]  [address]  Phone: [phone] Fax: [phone]  Hours: Not open 24 hours

## 2021-06-05 MED ORDER — METFORMIN HCL 1000 MG PO TABS: 1000.0000 mg | ORAL_TABLET | Freq: Two times a day (BID) | ORAL | 0 refills | Status: DC

## 2021-06-05 NOTE — Telephone Encounter (Signed)
Rx sent to cover her until March. She must be seen for additional refills. Has not been seen since 09/2020.

## 2021-06-05 NOTE — Addendum Note (Signed)
Addended by: Daisy Blossom, Annie Main L on: 06/05/2021 09:57 AM   Modules accepted: Orders

## 2021-06-05 NOTE — Telephone Encounter (Signed)
Called pt made aware of RX  

## 2021-06-10 ENCOUNTER — Ambulatory Visit: Payer: Medicare HMO | Admitting: Internal Medicine

## 2021-06-22 ENCOUNTER — Other Ambulatory Visit: Payer: Self-pay | Admitting: Internal Medicine

## 2021-06-22 DIAGNOSIS — E1165 Type 2 diabetes mellitus with hyperglycemia: Secondary | ICD-10-CM

## 2021-06-23 NOTE — Telephone Encounter (Signed)
Requested medication (s) are due for refill today: yes  Requested medication (s) are on the active medication list: yes  Last refill:  04/30/21 #30 with no RF  Future visit scheduled: 08/01/21  Notes to clinic:  Failed protocol of HbA1C be within 0-7.9 within 180 days, (HbA1C was 8.3 09/20/20) has upcoming appt, please assess.   Requested Prescriptions  Pending Prescriptions Disp Refills   JANUVIA 50 MG tablet [Pharmacy Med Name: JANUVIA 50 MG Tablet] 60 tablet     Sig: TAKE 1 TABLET EVERY DAY     Endocrinology:  Diabetes - DPP-4 Inhibitors Failed - 06/22/2021  9:56 PM      Failed - HBA1C is between 0 and 7.9 and within 180 days    HbA1c, POC (controlled diabetic range)  Date Value Ref Range Status  09/20/2020 8.3 (A) 0.0 - 7.0 % Final          Failed - Valid encounter within last 6 months    Recent Outpatient Visits           9 months ago Type 2 diabetes mellitus with hyperglycemia, without long-term current use of insulin (HCC)   [facility] [place], Gavin Pound B, MD   1 year ago Type 2 diabetes mellitus with hyperglycemia, without long-term current use of insulin (HCC)   [facility] Jonah Blue B, MD   1 year ago Type 2 diabetes mellitus without complication, without long-term current use of insulin (HCC)   [facility] Marcine Matar, MD   2 years ago Hyperlipidemia, unspecified hyperlipidemia type   [facility] Jonah Blue B, MD   2 years ago Diabetes mellitus type 2, uncontrolled, without complications [facility])   [facility] Marcine Matar, MD       Future Appointments             In 1 month Marcine Matar, MD [facility]            Passed - Cr in normal range and within 360 days    Creatinine, Ser  Date Value Ref Range Status  09/20/2020 0.57 0.57 - 1.00 mg/dL Final

## 2021-07-01 ENCOUNTER — Other Ambulatory Visit: Payer: Self-pay

## 2021-07-01 ENCOUNTER — Encounter: Payer: Self-pay | Admitting: Internal Medicine

## 2021-07-01 ENCOUNTER — Ambulatory Visit: Payer: Medicare HMO | Attending: Internal Medicine | Admitting: Internal Medicine

## 2021-07-01 VITALS — BP 123/84 | HR 76 | Resp 16 | Wt 124.2 lb

## 2021-07-01 DIAGNOSIS — K219 Gastro-esophageal reflux disease without esophagitis: Secondary | ICD-10-CM

## 2021-07-01 DIAGNOSIS — M810 Age-related osteoporosis without current pathological fracture: Secondary | ICD-10-CM

## 2021-07-01 DIAGNOSIS — E1165 Type 2 diabetes mellitus with hyperglycemia: Secondary | ICD-10-CM

## 2021-07-01 DIAGNOSIS — E1169 Type 2 diabetes mellitus with other specified complication: Secondary | ICD-10-CM

## 2021-07-01 DIAGNOSIS — I152 Hypertension secondary to endocrine disorders: Secondary | ICD-10-CM | POA: Diagnosis not present

## 2021-07-01 DIAGNOSIS — E1159 Type 2 diabetes mellitus with other circulatory complications: Secondary | ICD-10-CM

## 2021-07-01 DIAGNOSIS — E785 Hyperlipidemia, unspecified: Secondary | ICD-10-CM

## 2021-07-01 DIAGNOSIS — Z1231 Encounter for screening mammogram for malignant neoplasm of breast: Secondary | ICD-10-CM

## 2021-07-01 LAB — POCT GLYCOSYLATED HEMOGLOBIN (HGB A1C): HbA1c, POC (controlled diabetic range): 7.8 % — AB (ref 0.0–7.0)

## 2021-07-01 LAB — GLUCOSE, POCT (MANUAL RESULT ENTRY): POC Glucose: 259 mg/dl — AB (ref 70–99)

## 2021-07-01 MED ORDER — SITAGLIPTIN PHOSPHATE 50 MG PO TABS: 50.0000 mg | ORAL_TABLET | Freq: Every day | ORAL | 1 refills | Status: DC

## 2021-07-01 MED ORDER — METFORMIN HCL 1000 MG PO TABS: 1000.0000 mg | ORAL_TABLET | Freq: Two times a day (BID) | ORAL | 1 refills | Status: DC

## 2021-07-01 MED ORDER — SIMVASTATIN 20 MG PO TABS: ORAL_TABLET | ORAL | 1 refills | Status: DC

## 2021-07-01 MED ORDER — OMEPRAZOLE 20 MG PO CPDR: 20.0000 mg | DELAYED_RELEASE_CAPSULE | Freq: Every day | ORAL | 3 refills | Status: DC | PRN

## 2021-07-01 NOTE — Progress Notes (Signed)
Patient ID: Michele [name], female    DOB: 12/25/1950  MRN: [id number]  CC: Diabetes and Hypertension   Subjective: Michele [name] is a 71 y.o. female who presents for chronic ds management Her concerns today include:  Patient with history of DM 2, HTN, HL, vaginal prolapse, osteoporosis  Patient last seen 09/2020.Marland Kitchen    DIABETES TYPE 2 Last A1C:   Results for orders placed or performed in visit on 07/01/21  POCT glucose (manual entry)  Result Value Ref Range   POC Glucose 259 (A) 70 - 99 mg/dl  POCT glycosylated hemoglobin (Hb A1C)  Result Value Ref Range   Hemoglobin A1C     HbA1c POC (<> result, manual entry)     HbA1c, POC (prediabetic range)     HbA1c, POC (controlled diabetic range) 7.8 (A) 0.0 - 7.0 %   A1C improved from 8.3 Med Adherence:  _0  Yes    Metformin 1 gram BID, Glucotrol 10 mg BID, Januvia 50 mg daily Medication side effects:  _1  Yes    _2  No Home Monitoring?  _3  Yes  once a day in the mornings and sometimes in the evenings after dinner  Home glucose results range: 132-160 in the mornings.  Evening after dinner BS >200 Diet Adherence: _4  Yes    _5  No Exercise: _6  Yes  -walks 15-30 mins almost daily  _7  No Hypoglycemic episodes?: _8  Yes - once a few mths ago.  She did feel it     Numbness of the feet? _9  Yes    _10  No Retinopathy hx? _11  Yes    _12  No Last eye exam: up to date with eye exam Comments:   HTN:  On Lis/HCTZ.   Not checking BP lately; getting error message on her machine.   No chest pains, shortness of breath, lower extremity edema, headaches or dizziness.  HL:  Tolerating and taking Simvastatin without muscle cramps  Osteoporosis:  BMD 10/2020 c/w osteoporosis Decided not to take the once/wk Fosmax.  Afraid of possible S.E Taking Ca 600 mg BID and Vit D from OTC.    C/o acid reflux about once a wk.  Thinks it may be due to Januvia; thinks she read it can cause it. Endorses heartburn with certain foods including juices and spicy  foods Patient Active Problem List   Diagnosis Date Noted   Hyperlipidemia associated with type 2 diabetes mellitus ([place]) 07/02/2019   COVID-19 virus vaccination declined 06/30/2019   Complete uterine prolapse 10/27/2016   Controlled type 2 diabetes mellitus without complication, without long-term current use of insulin ([place]) 09/28/2016   Hypertension associated with diabetes ([place]) 09/28/2016   Hyperlipidemia 09/28/2016   Vaginal prolapse 09/28/2016     Current Outpatient Medications on File Prior to Visit  Medication Sig Dispense Refill   Blood Glucose Monitoring Suppl (TRUE METRIX METER) w/Device KIT Must keep upcoming office visit for refills 1 kit 0   calcium carbonate (CALCIUM 600) 600 MG TABS tablet Take 1 tablet (600 mg total) by mouth 2 (two) times daily with a meal. 60 tablet 3   glipiZIDE (GLUCOTROL) 10 MG tablet TAKE 1 TABLET BY MOUTH TWICE A DAY 180 tablet 2   glucose blood (ONETOUCH VERIO) test strip Must keep upcoming office visit for refills (Patient not taking: Reported on 09/20/2020) 100 strip 0   lisinopril-hydrochlorothiazide (ZESTORETIC) 20-25 MG tablet Take 1 tablet by mouth daily. 90 tablet 3   TRUEplus Lancets 28G MISC Use to check blood sugar twice daily. 100 each 2  Vitamin D, Cholecalciferol, 10 MCG (400 UNIT) TABS Take 10 mcg by mouth daily. 100 tablet 2   [DISCONTINUED] amLODipine (NORVASC) 5 MG tablet Take 1 tablet (5 mg total) by mouth daily. 30 tablet 5   No current facility-administered medications on file prior to visit.    Allergies  Allergen Reactions   Farxiga [Dapagliflozin] Other (See Comments)    Dizzy   Januvia [Sitagliptin] Other (See Comments)    Gives heartburn and upsets her stomach    Social History   Socioeconomic History   Marital status: Married    Spouse name: Not on file   Number of children: 2   Years of education: Not on file   Highest education level: Not on file  Occupational History   Occupation: LPN  Tobacco Use    Smoking status: Never   Smokeless tobacco: Never  Substance and Sexual Activity   Alcohol use: No   Drug use: No   Sexual activity: Yes  Other Topics Concern   Not on file  Social History Narrative   Not on file   Social Determinants of Health   Financial Resource Strain: Not on file  Food Insecurity: Not on file  Transportation Needs: Not on file  Physical Activity: Not on file  Stress: Not on file  Social Connections: Not on file  Intimate Partner Violence: Not on file    Family History  Problem Relation Age of Onset   Diabetes Mother    Hypertension Mother    Diabetes Father    Hypertension Father    Diabetes Brother    Hypertension Brother     Past Surgical History:  Procedure Laterality Date   CATARACT EXTRACTION, BILATERAL     08/21/2020 right, 09/11/2020 left    ROS: Review of Systems  Constitutional:        She reports independence in her ADLs.  Negative except as stated above  PHYSICAL EXAM: BP 123/84    Pulse 76    Resp 16    Wt 124 lb 3.2 oz (56.3 kg)    SpO2 98%    BMI 22.00 kg/m   Wt Readings from Last 3 Encounters:  07/01/21 124 lb 3.2 oz (56.3 kg)  09/20/20 126 lb 6.4 oz (57.3 kg)  12/08/19 126 lb 12.8 oz (57.5 kg)    Physical Exam  General appearance - alert, well appearing, and in no distress Mental status - normal mood, behavior, speech, dress, motor activity, and thought processes Neck - supple, no significant adenopathy Chest - clear to auscultation, no wheezes, rales or rhonchi, symmetric air entry Heart - normal rate, regular rhythm, normal S1, S2, no murmurs, rubs, clicks or gallops Extremities - peripheral pulses normal, no pedal edema, no clubbing or cyanosis Diabetic Foot Exam - Simple   Simple Foot Form Visual Inspection See comments: Yes Sensation Testing See comments: Yes Pulse Check See comments: Yes Comments Patient with varicose veins on the dorsal surface of both feet.      CMP Latest Ref Rng & Units 09/20/2020  06/09/2019 02/25/2018  Glucose 65 - 99 mg/dL 151(H) 188(H) 165(H)  BUN 8 - 27 mg/dL 6(L) 5(L) 9  Creatinine 0.57 - 1.00 mg/dL 0.57 0.66 0.60  Sodium 134 - 144 mmol/L 141 139 138  Potassium 3.5 - 5.2 mmol/L 4.2 4.1 3.8  Chloride 96 - 106 mmol/L 97 98 97  CO2 20 - 29 mmol/L _0 Calcium 8.7 - 10.3 mg/dL 10.1 9.9 9.6  Total Protein 6.0 - 8.5  g/dL 7.4 7.1 6.8  Total Bilirubin 0.0 - 1.2 mg/dL 0.3 0.4 <0.2  Alkaline Phos 44 - 121 IU/L 62 77 77  AST 0 - 40 IU/L 19 21 34  ALT 0 - 32 IU/L 16 15 35(H)   Lipid Panel     Component Value Date/Time   CHOL 145 09/20/2020 1206   TRIG 209 (H) 09/20/2020 1206   HDL 61 09/20/2020 1206   CHOLHDL 2.4 09/20/2020 1206   LDLCALC 51 09/20/2020 1206    CBC    Component Value Date/Time   WBC 8.9 09/20/2020 1206   RBC 4.68 09/20/2020 1206   HGB 12.6 09/20/2020 1206   HCT 38.6 09/20/2020 1206   PLT 341 09/20/2020 1206   MCV 83 09/20/2020 1206   MCH 26.9 09/20/2020 1206   MCHC 32.6 09/20/2020 1206   RDW 14.0 09/20/2020 1206    ASSESSMENT AND PLAN:  1. Type 2 diabetes mellitus with hyperglycemia, without long-term current use of insulin (HCC) A1c improved but not at goal.  Patient not interested in medication changes.  I was recommending increase Januvia but patient declines.  She will continue Januvia 50 mg daily, metformin and glipizide. Continue to encourage healthy eating habits.  Continue regular exercise. - POCT glucose (manual entry) - POCT glycosylated hemoglobin (Hb A1C) - sitaGLIPtin (JANUVIA) 50 MG tablet; Take 1 tablet (50 mg total) by mouth daily.  Dispense: 90 tablet; Refill: 1 - metFORMIN (GLUCOPHAGE) 1000 MG tablet; Take 1 tablet (1,000 mg total) by mouth 2 (two) times daily with a meal.  Dispense: 180 tablet; Refill: 1  2. Hypertension associated with diabetes ([place]) Close to goal.  Continue lisinopril/hydrochlorothiazide  3. Hyperlipidemia associated with type 2 diabetes mellitus (HCC) -Continue simvastatin.  Last LDL was  at goal of less than 70.   -Simvastatin (ZOCOR) 20 MG tablet; TAKE 1 TABLET (20 MG TOTAL) BY MOUTH DAILY.  Dispense: 90 tablet; Refill: 1  4. Gastroesophageal reflux disease without esophagitis GERD precautions discussed.  Advised to avoid certain foods like spicy foods, tomato-based foods, juices and excessive caffeine.  Advised to eat his last meal at least 2 to 3 hours before laying down at nights and to sleep with his head slightly elevated.   Started on omeprazole to use as needed.  5. Age-related osteoporosis without current pathological fracture Discussed risks of fragility fractures for untreated osteoporosis.  She is afraid to take Fosamax.  Discussed other options including Boniva or Prolia.  She sounds as though she would be more interested in the Prolia.  We will refer to endocrinology.  Printed information given on osteoporosis.  Encourage weightbearing exercises.  Continue calcium and vitamin D supplement. - Ambulatory referral to Endocrinology  6. Encounter for screening mammogram for malignant neoplasm of breast - MM DIGITAL SCREENING BILATERAL; Future     Patient was given the opportunity to ask questions.  Patient verbalized understanding of the plan and was able to repeat key elements of the plan.   Orders Placed This Encounter  Procedures   MM DIGITAL SCREENING BILATERAL   Ambulatory referral to Endocrinology   POCT glucose (manual entry)   POCT glycosylated hemoglobin (Hb A1C)     Requested Prescriptions   Signed Prescriptions Disp Refills   simvastatin (ZOCOR) 20 MG tablet 90 tablet 1    Sig: TAKE 1 TABLET (20 MG TOTAL) BY MOUTH DAILY.   sitaGLIPtin (JANUVIA) 50 MG tablet 90 tablet 1    Sig: Take 1 tablet (50 mg total) by mouth daily.  metFORMIN (GLUCOPHAGE) 1000 MG tablet 180 tablet 1    Sig: Take 1 tablet (1,000 mg total) by mouth 2 (two) times daily with a meal.   omeprazole (PRILOSEC) 20 MG capsule 30 capsule 3    Sig: Take 1 capsule (20 mg total) by  mouth daily as needed (for acid reflux).    Return in about 4 months (around 10/29/2021).  Karle Plumber, MD, FACP

## 2021-07-01 NOTE — Patient Instructions (Signed)
I have referred you to the endocrinologist for the osteoporosis.  Osteoporosis Osteoporosis happens when the bones become thin and less dense than normal. Osteoporosis makes bones more brittle and fragile and more likely to break (fracture). Over time, osteoporosis can cause your bones to become so weak that they fracture after a minor fall. Bones in the hip, wrist, and spine are most likely to fracture due to osteoporosis. What are the causes? The exact cause of this condition is not known. What increases the risk? You are more likely to develop this condition if you: Have family members with this condition. Have poor nutrition. Use the following: Steroid medicines, such as prednisone. Anti-seizure medicines. Nicotine or tobacco, such as cigarettes, e-cigarettes, and chewing tobacco. Are female. Are age 16 or older. Are not physically active (are sedentary). Are of European or Asian descent. Have a small body frame. What are the signs or symptoms? A fracture might be the first sign of osteoporosis, especially if the fracture results from a fall or injury that usually would not cause a bone to break. Other signs and symptoms include: Pain in the neck or low back. Stooped posture. Loss of height. How is this diagnosed? This condition may be diagnosed based on: Your medical history. A physical exam. A bone mineral density test, also called a DXA or DEXA test (dual-energy X-ray absorptiometry test). This test uses X-rays to measure the amount of minerals in your bones. How is this treated? This condition may be treated by: Making lifestyle changes, such as: Including foods with more calcium and vitamin D in your diet. Doing weight-bearing and muscle-strengthening exercises. Stopping tobacco use. Limiting alcohol intake. Taking medicine to slow the process of bone loss or to increase bone density. Taking daily supplements of calcium and vitamin D. Taking hormone replacement  medicines, such as estrogen for women and testosterone for men. Monitoring your levels of calcium and vitamin D. The goal of treatment is to strengthen your bones and lower your risk for a fracture. Follow these instructions at home: Eating and drinking Include calcium and vitamin D in your diet. Calcium is important for bone health, and vitamin D helps your body absorb calcium. Good sources of calcium and vitamin D include: Certain fatty fish, such as salmon and tuna. Products that have calcium and vitamin D added to them (are fortified), such as fortified cereals. Egg yolks. Cheese. Liver.  Activity Do exercises as told by your health care provider. Ask your health care provider what exercises and activities are safe for you. You should do: Exercises that make you work against gravity (weight-bearing exercises), such as tai chi, yoga, or walking. Exercises to strengthen muscles, such as lifting weights. Lifestyle Do not drink alcohol if: Your health care provider tells you not to drink. You are pregnant, may be pregnant, or are planning to become pregnant. If you drink alcohol: Limit how much you use to: 0-1 drink a day for women. 0-2 drinks a day for men. Know how much alcohol is in your drink. In the [place] one drink equals one 12 oz bottle of beer (355 mL), one 5 oz glass of wine (148 mL), or one 1 oz glass of hard liquor (44 mL). Do not use any products that contain nicotine or tobacco, such as cigarettes, e-cigarettes, and chewing tobacco. If you need help quitting, ask your health care provider. Preventing falls Use devices to help you move around (mobility aids) as needed, such as canes, walkers, scooters, or crutches. Keep rooms well-lit  and clutter-free. Remove tripping hazards from walkways, including cords and throw rugs. Install grab bars in bathrooms and safety rails on stairs. Use rubber mats in the bathroom and other areas that are often wet or slippery. Wear  closed-toe shoes that fit well and support your feet. Wear shoes that have rubber soles or low heels. Review your medicines with your health care provider. Some medicines can cause dizziness or changes in blood pressure, which can increase your risk of falling. General instructions Take over-the-counter and prescription medicines only as told by your health care provider. Keep all follow-up visits. This is important. Contact a health care provider if: You have never been screened for osteoporosis and you are: A woman who is age 68 or older. A man who is age 44 or older. Get help right away if: You fall or injure yourself. Summary Osteoporosis is thinning and loss of density in your bones. This makes bones more brittle and fragile and more likely to break (fracture),even with minor falls. The goal of treatment is to strengthen your bones and lower your risk for a fracture. Include calcium and vitamin D in your diet. Calcium is important for bone health, and vitamin D helps your body absorb calcium. Talk with your health care provider about screening for osteoporosis if you are a woman who is age 61 or older, or a man who is age 20 or older. This information is not intended to replace advice given to you by your health care provider. Make sure you discuss any questions you have with your health care provider. Document Revised: 10/12/2019 Document Reviewed: 10/12/2019 Elsevier Patient Education  [place].

## 2021-07-12 ENCOUNTER — Other Ambulatory Visit: Payer: Self-pay | Admitting: Internal Medicine

## 2021-07-12 DIAGNOSIS — I152 Hypertension secondary to endocrine disorders: Secondary | ICD-10-CM

## 2021-07-14 NOTE — Telephone Encounter (Signed)
Requested Prescriptions  ?Pending Prescriptions Disp Refills  ?? lisinopril-hydrochlorothiazide (ZESTORETIC) 20-25 MG tablet [Pharmacy Med Name: LISINOPRIL/HYDROCHLOROTHIAZIDE 20-25 MG Tablet] 90 tablet 0  ?  Sig: TAKE 1 TABLET EVERY DAY  ?  ? Cardiovascular:  ACEI + Diuretic Combos Failed - 07/12/2021  6:02 AM  ?  ?  Failed - Na in normal range and within 180 days  ?  Sodium  ?Date Value Ref Range Status  ?09/20/2020 141 134 - 144 mmol/L Final  ?   ?  ?  Failed - K in normal range and within 180 days  ?  Potassium  ?Date Value Ref Range Status  ?09/20/2020 4.2 3.5 - 5.2 mmol/L Final  ?   ?  ?  Failed - Cr in normal range and within 180 days  ?  Creatinine, Ser  ?Date Value Ref Range Status  ?09/20/2020 0.57 0.57 - 1.00 mg/dL Final  ?   ?  ?  Failed - eGFR is 30 or above and within 180 days  ?  GFR calc Af Amer  ?Date Value Ref Range Status  ?06/09/2019 106 >59 mL/min/1.73 Final  ? ?GFR calc non Af Amer  ?Date Value Ref Range Status  ?06/09/2019 92 >59 mL/min/1.73 Final  ? ?eGFR  ?Date Value Ref Range Status  ?09/20/2020 99 >59 mL/min/1.73 Final  ?   ?  ?  Passed - Patient is not pregnant  ?  ?  Passed - Last BP in normal range  ?  BP Readings from Last 1 Encounters:  ?07/01/21 123/84  ?   ?  ?  Passed - Valid encounter within last 6 months  ?  Recent Outpatient Visits   ?      ? 1 week ago Type 2 diabetes mellitus with hyperglycemia, without long-term current use of insulin ([place])  ? [place] Ladell Pier, MD  ? 9 months ago Type 2 diabetes mellitus with hyperglycemia, without long-term current use of insulin ([place])  ? [place] Ladell Pier, MD  ? 1 year ago Type 2 diabetes mellitus with hyperglycemia, without long-term current use of insulin ([place])  ? [place] Karle Plumber B, MD  ? 2 years ago Type 2 diabetes mellitus without complication, without long-term current use of insulin ([place])  ? [place] Karle Plumber B, MD  ? 2 years ago Hyperlipidemia, unspecified hyperlipidemia type  ? [place] Ladell Pier, MD  ?  ?  ?Future Appointments   ?        ? In 3 months Ladell Pier, MD [place]  ?  ? ?  ?  ?  ? ? ?

## 2021-08-01 ENCOUNTER — Ambulatory Visit: Payer: Medicare HMO | Admitting: Internal Medicine

## 2021-08-04 ENCOUNTER — Other Ambulatory Visit: Payer: Self-pay | Admitting: Internal Medicine

## 2021-08-04 DIAGNOSIS — Z1231 Encounter for screening mammogram for malignant neoplasm of breast: Secondary | ICD-10-CM

## 2021-08-14 ENCOUNTER — Ambulatory Visit
Admission: RE | Admit: 2021-08-14 | Discharge: 2021-08-14 | Disposition: A | Discharge status: Home / Self Care | Payer: Medicare HMO | Source: Ambulatory Visit | Attending: Internal Medicine | Admitting: Internal Medicine

## 2021-08-14 DIAGNOSIS — Z1231 Encounter for screening mammogram for malignant neoplasm of breast: Secondary | ICD-10-CM

## 2021-09-03 ENCOUNTER — Other Ambulatory Visit: Payer: Self-pay | Admitting: Internal Medicine

## 2021-09-03 DIAGNOSIS — E1165 Type 2 diabetes mellitus with hyperglycemia: Secondary | ICD-10-CM

## 2021-09-27 ENCOUNTER — Other Ambulatory Visit: Payer: Self-pay | Admitting: Internal Medicine

## 2021-09-29 NOTE — Telephone Encounter (Signed)
Requested Prescriptions  Pending Prescriptions Disp Refills  . omeprazole (PRILOSEC) 20 MG capsule [Pharmacy Med Name: OMEPRAZOLE DR 20 MG CAPSULE] 90 capsule 1    Sig: TAKE 1 CAPSULE (20 MG TOTAL) BY MOUTH DAILY AS NEEDED (FOR ACID REFLUX).     Gastroenterology: Proton Pump Inhibitors Passed - 09/27/2021  9:26 AM      Passed - Valid encounter within last 12 months    Recent Outpatient Visits          3 months ago Type 2 diabetes mellitus with hyperglycemia, without long-term current use of insulin (HCC)   [place] [facility] Jonah Blue B, MD   1 year ago Type 2 diabetes mellitus with hyperglycemia, without long-term current use of insulin [facility])   [place] [facility] Jonah Blue B, MD   1 year ago Type 2 diabetes mellitus with hyperglycemia, without long-term current use of insulin [facility])   [place] [facility] Jonah Blue B, MD   2 years ago Type 2 diabetes mellitus without complication, without long-term current use of insulin [facility])   [place] [facility] Marcine Matar, MD   2 years ago Hyperlipidemia, unspecified hyperlipidemia type   [facility] Marcine Matar, MD      Future Appointments            In 1 month [place], Binnie Rail, MD [place] [facility]

## 2021-10-15 ENCOUNTER — Other Ambulatory Visit: Payer: Self-pay | Admitting: Internal Medicine

## 2021-10-15 DIAGNOSIS — E1169 Type 2 diabetes mellitus with other specified complication: Secondary | ICD-10-CM

## 2021-10-15 NOTE — Telephone Encounter (Signed)
Refilled 07/01/2021 #90 1 refill - pt should have enough medication to last until 12/2021. Requested Prescriptions  Pending Prescriptions Disp Refills  . simvastatin (ZOCOR) 20 MG tablet [Pharmacy Med Name: SIMVASTATIN 20 MG Tablet] 90 tablet 1    Sig: TAKE 1 TABLET ONE TIME DAILY. MUST KEEP UPCOMING OFFICE VISIT FOR REFILLS.     Cardiovascular:  Antilipid - Statins Failed - 10/15/2021  2:36 AM      Failed - Lipid Panel in normal range within the last 12 months    Cholesterol, Total  Date Value Ref Range Status  09/20/2020 145 100 - 199 mg/dL Final   LDL Chol Calc (NIH)  Date Value Ref Range Status  09/20/2020 51 0 - 99 mg/dL Final   HDL  Date Value Ref Range Status  09/20/2020 61 >39 mg/dL Final   Triglycerides  Date Value Ref Range Status  09/20/2020 209 (H) 0 - 149 mg/dL Final         Passed - Patient is not pregnant      Passed - Valid encounter within last 12 months    Recent Outpatient Visits          3 months ago Type 2 diabetes mellitus with hyperglycemia, without long-term current use of insulin (HCC)   [facility] [place], Gavin Pound B, MD   1 year ago Type 2 diabetes mellitus with hyperglycemia, without long-term current use of insulin (HCC)   [place] [facility] Jonah Blue B, MD   1 year ago Type 2 diabetes mellitus with hyperglycemia, without long-term current use of insulin (HCC)   [place] [facility] [place], Gavin Pound B, MD   2 years ago Type 2 diabetes mellitus without complication, without long-term current use of insulin (HCC)   [place] [facility] Marcine Matar, MD   2 years ago Hyperlipidemia, unspecified hyperlipidemia type   [facility] Marcine Matar, MD      Future Appointments            In 2 weeks Marcine Matar, MD [facility]

## 2021-11-04 ENCOUNTER — Ambulatory Visit: Payer: Medicare HMO | Attending: Internal Medicine | Admitting: Internal Medicine

## 2021-11-04 ENCOUNTER — Encounter: Payer: Self-pay | Admitting: Internal Medicine

## 2021-11-04 VITALS — BP 158/84 | HR 71 | Resp 16 | Wt 124.0 lb

## 2021-11-04 DIAGNOSIS — M810 Age-related osteoporosis without current pathological fracture: Secondary | ICD-10-CM

## 2021-11-04 DIAGNOSIS — I152 Hypertension secondary to endocrine disorders: Secondary | ICD-10-CM

## 2021-11-04 DIAGNOSIS — E1159 Type 2 diabetes mellitus with other circulatory complications: Secondary | ICD-10-CM | POA: Diagnosis not present

## 2021-11-04 DIAGNOSIS — E1165 Type 2 diabetes mellitus with hyperglycemia: Secondary | ICD-10-CM

## 2021-11-04 DIAGNOSIS — E785 Hyperlipidemia, unspecified: Secondary | ICD-10-CM | POA: Diagnosis not present

## 2021-11-04 DIAGNOSIS — E1169 Type 2 diabetes mellitus with other specified complication: Secondary | ICD-10-CM

## 2021-11-04 DIAGNOSIS — Z2821 Immunization not carried out because of patient refusal: Secondary | ICD-10-CM

## 2021-11-04 LAB — POCT GLYCOSYLATED HEMOGLOBIN (HGB A1C): HbA1c, POC (controlled diabetic range): 8 % — AB (ref 0.0–7.0)

## 2021-11-04 LAB — GLUCOSE, POCT (MANUAL RESULT ENTRY): POC Glucose: 206 mg/dl — AB (ref 70–99)

## 2021-11-04 MED ORDER — GLIPIZIDE 10 MG PO TABS: ORAL_TABLET | ORAL | 2 refills | Status: DC

## 2021-11-04 MED ORDER — LISINOPRIL-HYDROCHLOROTHIAZIDE 20-25 MG PO TABS: 1.0000 | ORAL_TABLET | Freq: Every day | ORAL | 2 refills | Status: DC

## 2021-11-04 MED ORDER — METFORMIN HCL 1000 MG PO TABS: 1000.0000 mg | ORAL_TABLET | Freq: Two times a day (BID) | ORAL | 2 refills | Status: DC

## 2021-11-04 MED ORDER — AMLODIPINE BESYLATE 5 MG PO TABS: 5.0000 mg | ORAL_TABLET | Freq: Every day | ORAL | 3 refills | Status: DC

## 2021-11-05 LAB — MICROALBUMIN / CREATININE URINE RATIO
Creatinine, Urine: 112.8 mg/dL
Microalb/Creat Ratio: 9 mg/g creat (ref 0–29)
Microalbumin, Urine: 10.6 ug/mL

## 2021-11-05 LAB — COMPREHENSIVE METABOLIC PANEL
ALT: 18 IU/L (ref 0–32)
AST: 20 IU/L (ref 0–40)
Albumin/Globulin Ratio: 1.7 (ref 1.2–2.2)
Albumin: 4.5 g/dL (ref 3.8–4.8)
Alkaline Phosphatase: 62 IU/L (ref 44–121)
BUN/Creatinine Ratio: 14 (ref 12–28)
BUN: 8 mg/dL (ref 8–27)
Bilirubin Total: 0.4 mg/dL (ref 0.0–1.2)
CO2: 26 mmol/L (ref 20–29)
Calcium: 10.4 mg/dL — ABNORMAL HIGH (ref 8.7–10.3)
Chloride: 94 mmol/L — ABNORMAL LOW (ref 96–106)
Creatinine, Ser: 0.56 mg/dL — ABNORMAL LOW (ref 0.57–1.00)
Globulin, Total: 2.6 g/dL (ref 1.5–4.5)
Glucose: 178 mg/dL — ABNORMAL HIGH (ref 70–99)
Potassium: 4.5 mmol/L (ref 3.5–5.2)
Sodium: 137 mmol/L (ref 134–144)
Total Protein: 7.1 g/dL (ref 6.0–8.5)
eGFR: 99 mL/min/{1.73_m2} (ref >59)

## 2021-11-05 LAB — CBC
Hematocrit: 38.5 % (ref 34.0–46.6)
Hemoglobin: 12.6 g/dL (ref 11.1–15.9)
MCH: 27.1 pg (ref 26.6–33.0)
MCHC: 32.7 g/dL (ref 31.5–35.7)
MCV: 83 fL (ref 79–97)
Platelets: 321 10*3/uL (ref 150–450)
RBC: 4.65 x10E6/uL (ref 3.77–5.28)
RDW: 14 % (ref 11.7–15.4)
WBC: 7.7 10*3/uL (ref 3.4–10.8)

## 2021-11-05 LAB — LIPID PANEL
Chol/HDL Ratio: 2.1 ratio (ref 0.0–4.4)
Cholesterol, Total: 144 mg/dL (ref 100–199)
HDL: 67 mg/dL (ref >39)
LDL Chol Calc (NIH): 48 mg/dL (ref 0–99)
Triglycerides: 177 mg/dL — ABNORMAL HIGH (ref 0–149)
VLDL Cholesterol Cal: 29 mg/dL (ref 5–40)

## 2021-11-15 ENCOUNTER — Other Ambulatory Visit: Payer: Self-pay | Admitting: Internal Medicine

## 2021-11-15 DIAGNOSIS — E1165 Type 2 diabetes mellitus with hyperglycemia: Secondary | ICD-10-CM

## 2021-11-17 NOTE — Telephone Encounter (Signed)
last RF 07/01/21 #90 1 RF    should have enough to last until 12/29/21  Requested Prescriptions  Refused Prescriptions Disp Refills  . JANUVIA 50 MG tablet [Pharmacy Med Name: JANUVIA 50 MG Tablet] 90 tablet 1    Sig: TAKE 1 TABLET EVERY DAY     Endocrinology:  Diabetes - DPP-4 Inhibitors Failed - 11/15/2021  2:37 AM      Failed - HBA1C is between 0 and 7.9 and within 180 days    HbA1c, POC (controlled diabetic range)  Date Value Ref Range Status  11/04/2021 8.0 (A) 0.0 - 7.0 % Final         Failed - Cr in normal range and within 360 days    Creatinine, Ser  Date Value Ref Range Status  11/04/2021 0.56 (L) 0.57 - 1.00 mg/dL Final         Passed - Valid encounter within last 6 months    Recent Outpatient Visits          1 week ago Type 2 diabetes mellitus with hyperglycemia, without long-term current use of insulin (HCC)   [place] [facility] [place], Gavin Pound B, MD   4 months ago Type 2 diabetes mellitus with hyperglycemia, without long-term current use of insulin (HCC)   [place] [facility] [place], Gavin Pound B, MD   1 year ago Type 2 diabetes mellitus with hyperglycemia, without long-term current use of insulin (HCC)   [place] [facility] Jonah Blue B, MD   1 year ago Type 2 diabetes mellitus with hyperglycemia, without long-term current use of insulin [facility])   [place] [facility] [place], Gavin Pound B, MD   2 years ago Type 2 diabetes mellitus without complication, without long-term current use of insulin [facility])    [facility] Marcine Matar, MD      Future Appointments            In 3 months [place] Benes, Binnie Rail, MD [facility]

## 2021-12-12 ENCOUNTER — Encounter: Payer: Medicare HMO | Admitting: Pharmacist

## 2021-12-19 ENCOUNTER — Encounter: Payer: Medicare HMO | Admitting: Pharmacist

## 2021-12-24 ENCOUNTER — Other Ambulatory Visit: Payer: Self-pay | Admitting: Internal Medicine

## 2021-12-24 DIAGNOSIS — E1169 Type 2 diabetes mellitus with other specified complication: Secondary | ICD-10-CM

## 2022-01-15 DIAGNOSIS — Z7984 Long term (current) use of oral hypoglycemic drugs: Secondary | ICD-10-CM | POA: Diagnosis not present

## 2022-01-15 DIAGNOSIS — H524 Presbyopia: Secondary | ICD-10-CM | POA: Diagnosis not present

## 2022-01-15 DIAGNOSIS — Z961 Presence of intraocular lens: Secondary | ICD-10-CM | POA: Diagnosis not present

## 2022-01-15 DIAGNOSIS — E119 Type 2 diabetes mellitus without complications: Secondary | ICD-10-CM | POA: Diagnosis not present

## 2022-01-15 LAB — HM DIABETES EYE EXAM

## 2022-01-23 ENCOUNTER — Other Ambulatory Visit: Payer: Self-pay | Admitting: Internal Medicine

## 2022-01-23 DIAGNOSIS — E1165 Type 2 diabetes mellitus with hyperglycemia: Secondary | ICD-10-CM

## 2022-03-06 ENCOUNTER — Ambulatory Visit: Payer: Medicare HMO | Attending: Internal Medicine | Admitting: Internal Medicine

## 2022-03-06 ENCOUNTER — Encounter: Payer: Self-pay | Admitting: Internal Medicine

## 2022-03-06 VITALS — BP 134/75 | HR 77 | Wt 125.6 lb

## 2022-03-06 DIAGNOSIS — E1159 Type 2 diabetes mellitus with other circulatory complications: Secondary | ICD-10-CM

## 2022-03-06 DIAGNOSIS — I152 Hypertension secondary to endocrine disorders: Secondary | ICD-10-CM

## 2022-03-06 DIAGNOSIS — Z28310 Unvaccinated for covid-19: Secondary | ICD-10-CM | POA: Diagnosis not present

## 2022-03-06 DIAGNOSIS — M79672 Pain in left foot: Secondary | ICD-10-CM

## 2022-03-06 DIAGNOSIS — Z2821 Immunization not carried out because of patient refusal: Secondary | ICD-10-CM | POA: Diagnosis not present

## 2022-03-06 DIAGNOSIS — E785 Hyperlipidemia, unspecified: Secondary | ICD-10-CM

## 2022-03-06 DIAGNOSIS — Z1211 Encounter for screening for malignant neoplasm of colon: Secondary | ICD-10-CM

## 2022-03-06 DIAGNOSIS — E1165 Type 2 diabetes mellitus with hyperglycemia: Secondary | ICD-10-CM | POA: Diagnosis not present

## 2022-03-06 DIAGNOSIS — E1169 Type 2 diabetes mellitus with other specified complication: Secondary | ICD-10-CM

## 2022-03-06 LAB — GLUCOSE, POCT (MANUAL RESULT ENTRY): POC Glucose: 237 mg/dl — AB (ref 70–99)

## 2022-03-06 LAB — POCT GLYCOSYLATED HEMOGLOBIN (HGB A1C): HbA1c, POC (controlled diabetic range): 8.2 % — AB (ref 0.0–7.0)

## 2022-03-06 MED ORDER — CLINDAMYCIN HCL 300 MG PO CAPS: 300.0000 mg | ORAL_CAPSULE | Freq: Three times a day (TID) | ORAL | 0 refills | Status: DC

## 2022-03-06 NOTE — Progress Notes (Signed)
Patient ID: CONI HOMESLEY, female    DOB: 02-19-1951  MRN: [id number]  CC: Diabetes   Subjective: Michele [name] is a 71 y.o. female who presents for chronic ds management Her concerns today include:  Patient with history of DM 2, HTN, HL, vaginal prolapse, osteoporosis   Reports swelling in LT foot while in [place] for 2 wks.  Came back 19th of this mth.  Swelling better but still a little swollen.  also having intermittent stinging uncomfortable feeling in the foot.  DM: Results for orders placed or performed in visit on 03/06/22  POCT glucose (manual entry)  Result Value Ref Range   POC Glucose 237 (A) 70 - 99 mg/dl  POCT glycosylated hemoglobin (Hb A1C)  Result Value Ref Range   Hemoglobin A1C     HbA1c POC (<> result, manual entry)     HbA1c, POC (prediabetic range)     HbA1c, POC (controlled diabetic range) 8.2 (A) 0.0 - 7.0 %  -A1C has not improved. Reports compliance with taking Metformin 1 gram BID, Glucotrol 10 mg BID and Januvia 50 mg daily Checks BS every other day.  Range 150-200 before meals Feels eating habits good Not much exercise  HTN:  Reports compliance with Lis/HCTZ and Norvasc 5 mg Checks BP 2-3 x/wk.  Gives range up to 133/70-80 Limits salt in foods No CP/SOB  HL:  Last LDL 48.  Compliant with Zocor  HM:  Declines flu, shingrix and covid vaccine series. Due for colon CA screen  Patient Active Problem List   Diagnosis Date Noted   Age-related osteoporosis without current pathological fracture 07/01/2021   Hyperlipidemia associated with type 2 diabetes mellitus ([place]) 07/02/2019   COVID-19 virus vaccination declined 06/30/2019   Complete uterine prolapse 10/27/2016   Controlled type 2 diabetes mellitus without complication, without long-term current use of insulin ([place]) 09/28/2016   Hypertension associated with diabetes ([place]) 09/28/2016   Hyperlipidemia 09/28/2016   Vaginal prolapse 09/28/2016     Current Outpatient Medications on  File Prior to Visit  Medication Sig Dispense Refill   amLODipine (NORVASC) 5 MG tablet Take 1 tablet (5 mg total) by mouth daily. 90 tablet 3   Blood Glucose Monitoring Suppl (TRUE METRIX METER) w/Device KIT Must keep upcoming office visit for refills 1 kit 0   calcium carbonate (CALCIUM 600) 600 MG TABS tablet Take 1 tablet (600 mg total) by mouth 2 (two) times daily with a meal. 60 tablet 3   glipiZIDE (GLUCOTROL) 10 MG tablet TAKE 1 TABLET (10 MG TOTAL) TWICE DAILY. 180 tablet 2   glucose blood (ONETOUCH VERIO) test strip Must keep upcoming office visit for refills 100 strip 0   lisinopril-hydrochlorothiazide (ZESTORETIC) 20-25 MG tablet Take 1 tablet by mouth daily. 90 tablet 2   metFORMIN (GLUCOPHAGE) 1000 MG tablet Take 1 tablet (1,000 mg total) by mouth 2 (two) times daily with a meal. 180 tablet 2   omeprazole (PRILOSEC) 20 MG capsule TAKE 1 CAPSULE (20 MG TOTAL) BY MOUTH DAILY AS NEEDED (FOR ACID REFLUX). 90 capsule 1   simvastatin (ZOCOR) 20 MG tablet TAKE 1 TABLET ONE TIME DAILY. 90 tablet 0   sitaGLIPtin (JANUVIA) 50 MG tablet TAKE 1 TABLET EVERY DAY 90 tablet 0   TRUEplus Lancets 28G MISC Use to check blood sugar twice daily. 100 each 2   Vitamin D, Cholecalciferol, 10 MCG (400 UNIT) TABS Take 10 mcg by mouth daily. 100 tablet 2   No current facility-administered medications on file prior  to visit.    Allergies  Allergen Reactions   Farxiga [Dapagliflozin] Other (See Comments)    Dizzy   Januvia [Sitagliptin] Other (See Comments)    Gives heartburn and upsets her stomach    Social History   Socioeconomic History   Marital status: Married    Spouse name: Not on file   Number of children: 2   Years of education: Not on file   Highest education level: Not on file  Occupational History   Occupation: LPN  Tobacco Use   Smoking status: Never   Smokeless tobacco: Never  Substance and Sexual Activity   Alcohol use: No   Drug use: No   Sexual activity: Yes  Other Topics  Concern   Not on file  Social History Narrative   Not on file   Social Determinants of Health   Financial Resource Strain: Not on file  Food Insecurity: Not on file  Transportation Needs: Not on file  Physical Activity: Not on file  Stress: Not on file  Social Connections: Not on file  Intimate Partner Violence: Not on file    Family History  Problem Relation Age of Onset   Diabetes Mother    Hypertension Mother    Diabetes Father    Hypertension Father    Diabetes Brother    Hypertension Brother    Breast cancer Neg Hx     Past Surgical History:  Procedure Laterality Date   CATARACT EXTRACTION, BILATERAL     08/21/2020 right, 09/11/2020 left    ROS: Review of Systems Negative except as stated above  PHYSICAL EXAM: BP 134/75   Pulse 77   Wt 125 lb 9.6 oz (57 kg)   SpO2 98%   BMI 22.25 kg/m   Physical Exam  General appearance - alert, well appearing, and in no distress Mental status - normal mood, behavior, speech, dress, motor activity, and thought processes Neck - supple, no significant adenopathy Chest - clear to auscultation, no wheezes, rales or rhonchi, symmetric air entry Heart - normal rate, regular rhythm, normal S1, S2, no murmurs, rubs, clicks or gallops Musculoskeletal -left foot: Mild edema and erythema on the dorsal surface of the foot.  No fluctuance.  No tenderness on palpation.  She is good range of motion of the toes and ankle Extremities - no edema lower legs.  No calf tenderness      Latest Ref Rng & Units 11/04/2021   12:27 PM 09/20/2020   12:06 PM 06/09/2019   12:01 PM  CMP  Glucose 70 - 99 mg/dL 178  151  188   BUN 8 - 27 mg/dL 8  6  5   Creatinine 0.57 - 1.00 mg/dL 0.56  0.57  0.66   Sodium 134 - 144 mmol/L 137  141  139   Potassium 3.5 - 5.2 mmol/L 4.5  4.2  4.1   Chloride 96 - 106 mmol/L 94  97  98   CO2 20 - 29 mmol/L 26  28  25   Calcium 8.7 - 10.3 mg/dL 10.4  10.1  9.9   Total Protein 6.0 - 8.5 g/dL 7.1  7.4  7.1   Total  Bilirubin 0.0 - 1.2 mg/dL 0.4  0.3  0.4   Alkaline Phos 44 - 121 IU/L 62  62  77   AST 0 - 40 IU/L 20  19  21   ALT 0 - 32 IU/L 18  16  15    Lipid Panel       Component Value Date/Time   CHOL 144 11/04/2021 1227   TRIG 177 (H) 11/04/2021 1227   HDL 67 11/04/2021 1227   CHOLHDL 2.1 11/04/2021 1227   LDLCALC 48 11/04/2021 1227    CBC    Component Value Date/Time   WBC 7.7 11/04/2021 1227   RBC 4.65 11/04/2021 1227   HGB 12.6 11/04/2021 1227   HCT 38.5 11/04/2021 1227   PLT 321 11/04/2021 1227   MCV 83 11/04/2021 1227   MCH 27.1 11/04/2021 1227   MCHC 32.7 11/04/2021 1227   RDW 14.0 11/04/2021 1227    ASSESSMENT AND PLAN: 1. Type 2 diabetes mellitus with hyperglycemia, without long-term current use of insulin (HCC) Not at goal. Discussed the importance of healthy eating habits, regular aerobic exercise (at least 150 minutes a week as tolerated) and medication compliance to achieve or maintain control of diabetes. -I recommend that we increase the Januvia to 100 mg daily or add another oral medication.  Patient declined stating that she does not want to increase the Januvia and does not wish to have another medicine added. - POCT glucose (manual entry) - POCT glycosylated hemoglobin (Hb A1C)  2. Hypertension associated with diabetes ([place]) Close to goal.  Continue lisinopril/HCTZ 20/25 mg and Norvasc 5 mg daily.  3. Hyperlipidemia associated with type 2 diabetes mellitus ([place]) At goal.  Continue Zocor 20 mg daily.  4. Left foot pain Differential diagnoses include gout versus mild cellulitis.  I doubt gout as foot is not tender to touch.  I recommended keeping the foot elevated for the next several days.  We will put her on some clindamycin for 7 days.  Advised to follow-up with me if no improvement or if this gets worse. - clindamycin (CLEOCIN) 300 MG capsule; Take 1 capsule (300 mg total) by mouth 3 (three) times daily.  Dispense: 21 capsule; Refill: 0  5. Screening for  colon cancer Patient agreeable to doing Cologuard test. - Cologuard  6. Influenza vaccination declined Recommended.  Patient declined.  7. COVID-19 vaccine series declined Recommended.  Patient declined.     Patient was given the opportunity to ask questions.  Patient verbalized understanding of the plan and was able to repeat key elements of the plan.   This documentation was completed using Radio producer.  Any transcriptional errors are unintentional.  Orders Placed This Encounter  Procedures   Cologuard   POCT glucose (manual entry)   POCT glycosylated hemoglobin (Hb A1C)     Requested Prescriptions   Signed Prescriptions Disp Refills   clindamycin (CLEOCIN) 300 MG capsule 21 capsule 0    Sig: Take 1 capsule (300 mg total) by mouth 3 (three) times daily.    Return in about 4 months (around 07/07/2022) for [place] with Alycia/CMA in 2-4 wks.Karle Plumber, MD, FACP

## 2022-03-16 ENCOUNTER — Encounter: Payer: Self-pay | Admitting: Obstetrics & Gynecology

## 2022-03-16 ENCOUNTER — Ambulatory Visit: Payer: Medicare HMO | Admitting: Obstetrics & Gynecology

## 2022-03-16 VITALS — BP 123/69 | HR 82 | Wt 123.8 lb

## 2022-03-16 DIAGNOSIS — N813 Complete uterovaginal prolapse: Secondary | ICD-10-CM

## 2022-03-16 MED ORDER — PREMARIN 0.625 MG/GM VA CREA: 1.0000 | TOPICAL_CREAM | Freq: Every day | VAGINAL | 12 refills | Status: DC

## 2022-03-16 NOTE — Progress Notes (Signed)
   GYNECOLOGY OFFICE VISIT NOTE  History:   Michele [name] is a 71 y.o. [id number] here today for management of known uterovaginal prolapse.  Has been using #2 ring with support pessary but the support has a little tear now.  No incontinence issues. Reports dryness and discomfort, has not used estrogen cream as directed by Michele [name].  Interested in discussing surgical management.  She denies any abnormal vaginal discharge, bleeding, pelvic pain or other concerns.    Past Medical History:  Diagnosis Date   Diabetes mellitus without complication ([place])    Hypertension     Past Surgical History:  Procedure Laterality Date   CATARACT EXTRACTION, BILATERAL     08/21/2020 right, 09/11/2020 left    The following portions of the patient's history were reviewed and updated as appropriate: allergies, current medications, past family history, past medical history, past social history, past surgical history and problem list.   Review of Systems:  Pertinent items noted in HPI and remainder of comprehensive ROS otherwise negative.  Physical Exam:  BP 123/69   Pulse 82   Wt 123 lb 12.8 oz (56.2 kg)   BMI 21.93 kg/m  CONSTITUTIONAL: Well-developed, well-nourished female in no acute distress.  HEENT:  Normocephalic, atraumatic. External right and left ear normal. No scleral icterus.  NECK: Normal range of motion, supple, no masses noted on observation SKIN: No rash noted. Not diaphoretic. No erythema. No pallor. MUSCULOSKELETAL: Normal range of motion. No edema noted. NEUROLOGIC: Alert and oriented to person, place, and time. Normal muscle tone coordination. No cranial nerve deficit noted. PSYCHIATRIC: Normal mood and affect. Normal behavior. Normal judgment and thought content. CARDIOVASCULAR: Normal heart rate noted RESPIRATORY: Effort and breath sounds normal, no problems with respiration noted ABDOMEN: No masses noted. No other overt distention noted.   PELVIC: External genitalia with  significant atrophy; atrophic urethral meatus; Grade 2 uterovaginal prolapse noted.  No abnormal discharge noted. Performed in the presence of a chaperone    Assessment and Plan:    1. Complete uterine prolapse Examined her pessary, patient can continue to use this for now as tear is very small.  Recommended use of estrogen vaginal cream for the dryness.  Referred to Michele [name] (Urogynecology) for discussion of further management. - conjugated estrogens (PREMARIN) vaginal cream; Place 1 Applicatorful vaginally at bedtime. For 2 weeks then use three times a week  Dispense: 30 g; Refill: 12 - Ambulatory referral to Urogynecology  Routine preventative health maintenance measures emphasized. Please refer to After Visit Summary for other counseling recommendations.   Return for any gynecologic concerns.    I spent 30 minutes dedicated to the care of this patient including pre-visit review of records, face to face time with the patient discussing her conditions and treatments and post visit orders.    Michele Schneiders, MD, [place] for [employer], [place]

## 2022-03-17 ENCOUNTER — Encounter: Payer: Self-pay | Admitting: *Deleted

## 2022-04-09 DIAGNOSIS — Z1211 Encounter for screening for malignant neoplasm of colon: Secondary | ICD-10-CM | POA: Diagnosis not present

## 2022-04-13 ENCOUNTER — Encounter: Payer: Medicare HMO | Admitting: Pharmacist

## 2022-04-14 ENCOUNTER — Telehealth: Payer: Self-pay | Admitting: Internal Medicine

## 2022-04-14 ENCOUNTER — Ambulatory Visit: Payer: Medicare HMO | Admitting: Internal Medicine

## 2022-04-14 ENCOUNTER — Encounter: Payer: Self-pay | Admitting: Internal Medicine

## 2022-04-14 VITALS — BP 122/70 | HR 79 | Ht 63.0 in | Wt 126.0 lb

## 2022-04-14 DIAGNOSIS — M810 Age-related osteoporosis without current pathological fracture: Secondary | ICD-10-CM | POA: Diagnosis not present

## 2022-04-14 LAB — VITAMIN D 25 HYDROXY (VIT D DEFICIENCY, FRACTURES): VITD: 33.83 ng/mL (ref 30.00–100.00)

## 2022-04-14 LAB — BASIC METABOLIC PANEL
BUN: 9 mg/dL (ref 6–23)
CO2: 27 mEq/L (ref 19–32)
Calcium: 10.1 mg/dL (ref 8.4–10.5)
Chloride: 96 mEq/L (ref 96–112)
Creatinine, Ser: 0.67 mg/dL (ref 0.40–1.20)
GFR: 88.66 mL/min (ref >60.00)
Glucose, Bld: 267 mg/dL — ABNORMAL HIGH (ref 70–99)
Potassium: 4.1 mEq/L (ref 3.5–5.1)
Sodium: 136 mEq/L (ref 135–145)

## 2022-04-14 LAB — TSH: TSH: 1.42 u[IU]/mL (ref 0.35–5.50)

## 2022-04-14 LAB — T4, FREE: Free T4: 0.91 ng/dL (ref 0.60–1.60)

## 2022-04-14 LAB — ALBUMIN: Albumin: 4.3 g/dL (ref 3.5–5.2)

## 2022-04-14 NOTE — Telephone Encounter (Signed)
New start    HPI: Ms. Michele [name] is a 71 y.o. female with a past medical history of HTN, DM and Dyslipidemia. The patient presented for initial endocrinology clinic visit on 04/14/2022 for consultative assistance with her Osteoporosis.    Pt was diagnosed with osteoporosis:11/07/2020 with a T-score of -3.6 at the [facility] in June 2022   Menarche at age :  Menopausal at age : at 83 Fracture Hx: ankle fracture  in her 69's. S/P slip injury  Hx of HRT: no  FH of osteoporosis or hip fracture: no Prior Hx of anti-resorptive therapy :  She was prescribed Alendronate but she did not take it due to skeptics about side effects      In reviewing her labs she was noted with slightly elevated serum calcium at 10.4 mg/DL on 11/04/2021 with an albumin of 4.5 with a corrected serum calcium of 10.0 mg/DL   Has occasional heartburn    She is on Calcium 600 mg BID  Vitamin D 1000 iu daily    HISTORY:  Past Medical History:      Past Medical History:  Diagnosis Date   Diabetes mellitus without complication ([place])     Hypertension      Past Surgical History:       Past Surgical History:  Procedure Laterality Date   CATARACT EXTRACTION, BILATERAL        08/21/2020 right, 09/11/2020 left    Social History:  reports that she has never smoked. She has never used smokeless tobacco. She reports that she does not drink alcohol and does not use drugs. Family History: family history includes Diabetes in her brother, father, and mother; Hypertension in her brother, father, and mother.     HOME MEDICATIONS: Allergies as of 04/14/2022         Reactions    Farxiga [dapagliflozin] Other (See Comments)    Dizzy    Januvia [sitagliptin] Other (See Comments)    Gives heartburn and upsets her stomach            Medication List           Accurate as of April 14, 2022  7:10 AM. If you have any questions, ask your nurse or doctor.              amLODipine 5 MG tablet Commonly known as:  NORVASC Take 1 tablet (5 mg total) by mouth daily.    calcium carbonate 600 MG Tabs tablet Commonly known as: Calcium 600 Take 1 tablet (600 mg total) by mouth 2 (two) times daily with a meal.    clindamycin 300 MG capsule Commonly known as: CLEOCIN Take 1 capsule (300 mg total) by mouth 3 (three) times daily.    glipiZIDE 10 MG tablet Commonly known as: GLUCOTROL TAKE 1 TABLET (10 MG TOTAL) TWICE DAILY.    Januvia 50 MG tablet Generic drug: sitaGLIPtin TAKE 1 TABLET EVERY DAY    lisinopril-hydrochlorothiazide 20-25 MG tablet Commonly known as: ZESTORETIC Take 1 tablet by mouth daily.    metFORMIN 1000 MG tablet Commonly known as: GLUCOPHAGE Take 1 tablet (1,000 mg total) by mouth 2 (two) times daily with a meal.    omeprazole 20 MG capsule Commonly known as: PRILOSEC TAKE 1 CAPSULE (20 MG TOTAL) BY MOUTH DAILY AS NEEDED (FOR ACID REFLUX).    OneTouch Verio test strip Generic drug: glucose blood Must keep upcoming office visit for refills    Premarin vaginal cream Generic drug: conjugated estrogens Place 1  Applicatorful vaginally at bedtime. For 2 weeks then use three times a week    simvastatin 20 MG tablet Commonly known as: ZOCOR TAKE 1 TABLET ONE TIME DAILY.    True Metrix Meter w/Device Kit Must keep upcoming office visit for refills    TRUEplus Lancets 28G Misc Use to check blood sugar twice daily.    Vitamin D (Cholecalciferol) 10 MCG (400 UNIT) Tabs Take 10 mcg by mouth daily.               REVIEW OF SYSTEMS: A comprehensive ROS was conducted with the patient and is negative except as per HPI and below:  ROS        OBJECTIVE:  VS: There were no vitals taken for this visit.       Wt Readings from Last 3 Encounters:  03/16/22 123 lb 12.8 oz (56.2 kg)  03/06/22 125 lb 9.6 oz (57 kg)  11/04/21 124 lb (56.2 kg)        EXAM: General: Pt appears well and is in NAD  Eyes: External eye exam normal without stare, lid lag or exophthalmos.  EOM  intact.  PERRL.  Neck: General: Supple without adenopathy. Thyroid: Thyroid size normal.  No goiter or nodules appreciated. No thyroid bruit.  Lungs: Clear with good BS bilat with no rales, rhonchi, or wheezes  Heart: Auscultation: RRR.  Abdomen: Normoactive bowel sounds, soft, nontender, without masses or organomegaly palpable  Extremities:   BL LE: No pretibial edema normal ROM and strength.  Mental Status: Judgment, insight: Intact Orientation: Oriented to time, place, and person Mood and affect: No depression, anxiety, or agitation        DATA REVIEWED:   Latest Reference Range & Units 11/04/21 12:27  Sodium 134 - 144 mmol/L 137  Potassium 3.5 - 5.2 mmol/L 4.5  Chloride 96 - 106 mmol/L 94 (L)  CO2 20 - 29 mmol/L 26  Glucose 70 - 99 mg/dL 178 (H)  BUN 8 - 27 mg/dL 8  Creatinine 0.57 - 1.00 mg/dL 0.56 (L)  Calcium 8.7 - 10.3 mg/dL 10.4 (H)  BUN/Creatinine Ratio 12 - 28  14  eGFR >59 mL/min/1.73 99  Alkaline Phosphatase 44 - 121 IU/L 62  Albumin 3.8 - 4.8 g/dL 4.5  Albumin/Globulin Ratio 1.2 - 2.2  1.7  AST 0 - 40 IU/L 20  ALT 0 - 32 IU/L 18  Total Protein 6.0 - 8.5 g/dL 7.1  Total Bilirubin 0.0 - 1.2 mg/dL 0.4  Total CHOL/HDL Ratio 0.0 - 4.4 ratio 2.1  Cholesterol, Total 100 - 199 mg/dL 144  HDL Cholesterol >39 mg/dL 67  MICROALB/CREAT RATIO 0 - 29 mg/g creat 9  Triglycerides 0 - 149 mg/dL 177 (H)  VLDL Cholesterol Cal 5 - 40 mg/dL 29  LDL Chol Calc (NIH) 0 - 99 mg/dL 48  Globulin, Total 1.5 - 4.5 g/dL 2.6      DXA 11/07/2020  FINDINGS: [facility] LUMBAR SPINE L2-L4   Bone Mineral Density (BMD):  0.686 g/cm2   Young Adult T-Score:  -3.6       LEFT FEMUR total   Bone Mineral Density (BMD):  0.718 g/cm2   Young Adult T-Score: -1.8     RIGHT FOREARM (1/3 RADIUS)   Bone Mineral Density (BMD):  0.497   Young Adult T Score:  -3.3   Z Score:  -1.3     ASSESSMENT/PLAN/RECOMMENDATIONS:    Osteoporosis :   - Emphasized the importance of optimizing calcium and  vitamin D intake  -  Discussed weight bearing exercise  - We discussed the degree of her osteoporosis with a T Score -3.6 (as it appears she did not the gravity of her condition, stating " that's what they say " when she was asked if she has osteoporosis - I also discussed her high risk of bone fractures  - Her PCP prescribed alendronate but she did not take it after reading side effects, she already has heartburn  - We discussed Prolia, discussed side effects of allergic reaction  and hypocalcemia and the benefit outweighs the risk    Medications : Calcium 600 mg BID  Vitamin D 1000 iu daily  Prolia 60 mg SQ Q 6 months      F/U 6 months    Signed electronically by: Mack Guise, MD

## 2022-04-14 NOTE — Telephone Encounter (Signed)
Brandy,   Can you please add this pt to the Prolia list ?  Thank you

## 2022-04-14 NOTE — Patient Instructions (Signed)
Calcium 600 mg twice daily  Vitamin D 1000 iu daily   Weight bearing exercises are helpful to the bones

## 2022-04-14 NOTE — Progress Notes (Unsigned)
Name: Michele [name]  MRN/ DOB: [id number], 1950-07-14    Age/ Sex: 71 y.o., female    PCP: Ladell Pier, MD   Reason for Endocrinology Evaluation: Osteoporosis     Date of Initial Endocrinology Evaluation: 04/14/2022     HPI: Michele [name] is a 71 y.o. female with a past medical history of HTN, DM and Dyslipidemia. The patient presented for initial endocrinology clinic visit on 04/14/2022 for consultative assistance with her Osteoporosis.   Pt was diagnosed with osteoporosis:11/07/2020 with a T-score of -3.6 at the [facility] in June 2022  Menarche at age :  Menopausal at age : at 54 Fracture Hx: ankle fracture  in her 64's. S/P slip injury  Hx of HRT: no  FH of osteoporosis or hip fracture: no Prior Hx of anti-resorptive therapy :  She was prescribed Alendronate but she did not take it due to skeptics about side effects    In reviewing her labs she was noted with slightly elevated serum calcium at 10.4 mg/DL on 11/04/2021 with an albumin of 4.5 with a corrected serum calcium of 10.0 mg/DL  Has occasional heartburn   She is on Calcium 600 mg BID  Vitamin D 1000 iu daily   HISTORY:  Past Medical History:  Past Medical History:  Diagnosis Date   Diabetes mellitus without complication ([place])    Hypertension    Past Surgical History:  Past Surgical History:  Procedure Laterality Date   CATARACT EXTRACTION, BILATERAL     08/21/2020 right, 09/11/2020 left    Social History:  reports that she has never smoked. She has never used smokeless tobacco. She reports that she does not drink alcohol and does not use drugs. Family History: family history includes Diabetes in her brother, father, and mother; Hypertension in her brother, father, and mother.   HOME MEDICATIONS: Allergies as of 04/14/2022       Reactions   Farxiga [dapagliflozin] Other (See Comments)   Dizzy   Januvia [sitagliptin] Other (See Comments)   Gives heartburn and upsets her stomach         Medication List        Accurate as of April 14, 2022  7:10 AM. If you have any questions, ask your nurse or doctor.          amLODipine 5 MG tablet Commonly known as: NORVASC Take 1 tablet (5 mg total) by mouth daily.   calcium carbonate 600 MG Tabs tablet Commonly known as: Calcium 600 Take 1 tablet (600 mg total) by mouth 2 (two) times daily with a meal.   clindamycin 300 MG capsule Commonly known as: CLEOCIN Take 1 capsule (300 mg total) by mouth 3 (three) times daily.   glipiZIDE 10 MG tablet Commonly known as: GLUCOTROL TAKE 1 TABLET (10 MG TOTAL) TWICE DAILY.   Januvia 50 MG tablet Generic drug: sitaGLIPtin TAKE 1 TABLET EVERY DAY   lisinopril-hydrochlorothiazide 20-25 MG tablet Commonly known as: ZESTORETIC Take 1 tablet by mouth daily.   metFORMIN 1000 MG tablet Commonly known as: GLUCOPHAGE Take 1 tablet (1,000 mg total) by mouth 2 (two) times daily with a meal.   omeprazole 20 MG capsule Commonly known as: PRILOSEC TAKE 1 CAPSULE (20 MG TOTAL) BY MOUTH DAILY AS NEEDED (FOR ACID REFLUX).   OneTouch Verio test strip Generic drug: glucose blood Must keep upcoming office visit for refills   Premarin vaginal cream Generic drug: conjugated estrogens Place 1 Applicatorful vaginally at bedtime. For 2 weeks  then use three times a week   simvastatin 20 MG tablet Commonly known as: ZOCOR TAKE 1 TABLET ONE TIME DAILY.   True Metrix Meter w/Device Kit Must keep upcoming office visit for refills   TRUEplus Lancets 28G Misc Use to check blood sugar twice daily.   Vitamin D (Cholecalciferol) 10 MCG (400 UNIT) Tabs Take 10 mcg by mouth daily.          REVIEW OF SYSTEMS: A comprehensive ROS was conducted with the patient and is negative except as per HPI and below:  ROS     OBJECTIVE:  VS: There were no vitals taken for this visit.   Wt Readings from Last 3 Encounters:  03/16/22 123 lb 12.8 oz (56.2 kg)  03/06/22 125 lb 9.6 oz (57 kg)   11/04/21 124 lb (56.2 kg)     EXAM: General: Pt appears well and is in NAD  Eyes: External eye exam normal without stare, lid lag or exophthalmos.  EOM intact.  PERRL.  Neck: General: Supple without adenopathy. Thyroid: Thyroid size normal.  No goiter or nodules appreciated. No thyroid bruit.  Lungs: Clear with good BS bilat with no rales, rhonchi, or wheezes  Heart: Auscultation: RRR.  Abdomen: Normoactive bowel sounds, soft, nontender, without masses or organomegaly palpable  Extremities:  BL LE: No pretibial edema normal ROM and strength.  Mental Status: Judgment, insight: Intact Orientation: Oriented to time, place, and person Mood and affect: No depression, anxiety, or agitation     DATA REVIEWED:   Latest Reference Range & Units 04/14/22 10:50  Sodium 135 - 145 mEq/L 136  Potassium 3.5 - 5.1 mEq/L 4.1  Chloride 96 - 112 mEq/L 96  CO2 19 - 32 mEq/L 27  Glucose 70 - 99 mg/dL 267 (H)  BUN 6 - 23 mg/dL 9  Creatinine 0.40 - 1.20 mg/dL 0.67  Calcium 8.4 - 10.5 mg/dL 10.1  Albumin 3.5 - 5.2 g/dL 4.3  GFR >60.00 mL/min 88.66    Latest Reference Range & Units 04/14/22 10:50  VITD 30.00 - 100.00 ng/mL 33.83    Latest Reference Range & Units 04/14/22 10:50  TSH 0.35 - 5.50 uIU/mL 1.42  T4,Free(Direct) 0.60 - 1.60 ng/dL 0.91    DXA 11/07/2020  FINDINGS: [facility] LUMBAR SPINE L2-L4   Bone Mineral Density (BMD):  0.686 g/cm2   Young Adult T-Score:  -3.6      LEFT FEMUR total   Bone Mineral Density (BMD):  0.718 g/cm2   Young Adult T-Score: -1.8     RIGHT FOREARM (1/3 RADIUS)   Bone Mineral Density (BMD):  0.497   Young Adult T Score:  -3.3   Z Score:  -1.3    ASSESSMENT/PLAN/RECOMMENDATIONS:   Osteoporosis :  - Emphasized the importance of optimizing calcium and vitamin D intake  - Discussed weight bearing exercise  - We discussed the degree of her osteoporosis with a T Score -3.6 (as it appears she did not the gravity of her condition, stating " that's  what they say " when she was asked if she has osteoporosis - I also discussed her high risk of bone fractures  - Her PCP prescribed alendronate but she did not take it after reading side effects, she already has heartburn  - We discussed Prolia, discussed side effects of allergic reaction  and hypocalcemia and the benefit outweighs the risk   Medications : Calcium 600 mg BID  Vitamin D 1000 iu daily  Prolia 60 mg SQ Q 6 months  F/U 6 months   Signed electronically by: Mack Guise, MD  [facility]  [address] [place],  [address] Phone: [phone] FAX: [phone]   CC: Ladell Pier, MD [address] [place] [address] Phone: [phone] Fax: [phone]   Return to Endocrinology clinic as below: Future Appointments  Date Time Provider [place]  04/14/2022 10:10 AM Lorene Samaan, Melanie Crazier, MD [facility] None  07/07/2022 11:10 AM Ladell Pier, MD [facility] None

## 2022-04-15 ENCOUNTER — Encounter: Payer: Self-pay | Admitting: Internal Medicine

## 2022-04-16 LAB — PARATHYROID HORMONE, INTACT (NO CA): PTH: 22 pg/mL (ref 16–77)

## 2022-04-16 NOTE — Telephone Encounter (Signed)
Prolia VOB initiated via [email or web address] ? ?New start ? ?

## 2022-04-17 LAB — COLOGUARD: COLOGUARD: NEGATIVE

## 2022-04-20 ENCOUNTER — Telehealth: Payer: Self-pay

## 2022-04-20 NOTE — Telephone Encounter (Signed)
Pt was called and is aware of results, DOB was confirmed.  ?

## 2022-04-20 NOTE — Telephone Encounter (Signed)
Prior auth required for PROLIA ? ?PA PROCESS DETAILS: PA is required. PA can be initiated by calling [phone] or online at ?[email or web address] ? ?

## 2022-04-20 NOTE — Telephone Encounter (Signed)
-----   Message from Marcine Matar, MD sent at 04/18/2022  9:10 AM EST ----- Normal Cologuard test.  Will be due again in 3 years.

## 2022-05-06 NOTE — Telephone Encounter (Addendum)
Prior Authorization initiated for PROLIA via [email or web address] KEY: [id number], PA Case ID #: [id number]

## 2022-05-19 NOTE — Telephone Encounter (Signed)
We cover this drug when our criteria are met. The unmet criteria are: has a history of osteoporotic fracture OR has tried or cannot use zoledronic acid (step therapy requirement). This decision was from [employer]&ap[employer]s Prolia (denosumab) Pharmacy Coverage Policy.  Dr. Kelton Pillar, I did include the fracture of her ankle in her 6s but it looks like they are requiring use of Reclast first.   Would you like to try to appeal the denial?

## 2022-05-20 ENCOUNTER — Telehealth: Payer: Self-pay | Admitting: Pharmacy Technician

## 2022-05-20 ENCOUNTER — Encounter: Payer: Self-pay | Admitting: Internal Medicine

## 2022-05-20 NOTE — Addendum Note (Signed)
Addended by: Dorita Sciara on: 05/20/2022 09:47 AM   Modules accepted: Orders

## 2022-05-20 NOTE — Telephone Encounter (Addendum)
Dr. Kelton Pillar, Juluis Rainier note:  Auth Submission: NO AUTH NEEDED Payer: [employer] MEDICARE Medication & CPT/J Code(s) submitted: Reclast (Zolendronic acid) V6160 Route of submission (phone, fax, portal):  Phone # Fax # Auth type: Buy/Bill Units/visits requested: X1 Reference number:  Approval from: 05/20/22 to 05/11/23   Patient will be scheduled as soon as possible

## 2022-05-21 NOTE — Telephone Encounter (Signed)
Patient is aware of INR results. Verified current dose of 4 mg every Tue, Thu, Sat; 6 mg all other days. Verified patient findings, no complaints. Patient is aware to recheck in 1 week.   Charge Captured.

## 2022-06-03 ENCOUNTER — Other Ambulatory Visit: Payer: Self-pay | Admitting: Internal Medicine

## 2022-06-03 DIAGNOSIS — E1169 Type 2 diabetes mellitus with other specified complication: Secondary | ICD-10-CM

## 2022-06-03 NOTE — Telephone Encounter (Signed)
Pt archived in [email or web address].  Please advise if patient and/or provider wish to proceed with Prolia therpay.  

## 2022-06-05 ENCOUNTER — Ambulatory Visit (INDEPENDENT_AMBULATORY_CARE_PROVIDER_SITE_OTHER): Payer: Medicare HMO

## 2022-06-05 VITALS — BP 111/69 | HR 78 | Temp 98.2°F | Resp 16 | Ht 63.0 in | Wt 122.8 lb

## 2022-06-05 DIAGNOSIS — M810 Age-related osteoporosis without current pathological fracture: Secondary | ICD-10-CM | POA: Diagnosis not present

## 2022-06-05 MED ORDER — ACETAMINOPHEN 325 MG PO TABS
650.0000 mg | ORAL_TABLET | Freq: Once | ORAL | Status: AC
  Administered 2022-06-05: 650 mg via ORAL
  Filled 2022-06-05: qty 2

## 2022-06-05 MED ORDER — DIPHENHYDRAMINE HCL 25 MG PO CAPS
25.0000 mg | ORAL_CAPSULE | Freq: Once | ORAL | Status: AC
  Administered 2022-06-05: 25 mg via ORAL
  Filled 2022-06-05: qty 1

## 2022-06-05 MED ORDER — ZOLEDRONIC ACID 5 MG/100ML IV SOLN
5.0000 mg | Freq: Once | INTRAVENOUS | Status: AC
  Administered 2022-06-05: 5 mg via INTRAVENOUS
  Filled 2022-06-05: qty 100

## 2022-06-05 NOTE — Patient Instructions (Signed)

## 2022-06-05 NOTE — Progress Notes (Signed)
Diagnosis: Osteoporosis  Provider:  Marshell Garfinkel MD  Procedure: Infusion  IV Type: Peripheral, IV Location: R Forearm  Reclast (Zolendronic Acid), Dose: 5 mg  Infusion Start Time: 1207  Infusion Stop Time: 1240  Post Infusion IV Care: Observation period completed and Peripheral IV Discontinued  Discharge: Condition: Good, Destination: Home . AVS provided to patient.   Performed by:  Adelina Mings, LPN

## 2022-06-18 ENCOUNTER — Other Ambulatory Visit: Payer: Self-pay | Admitting: Internal Medicine

## 2022-06-18 DIAGNOSIS — E1165 Type 2 diabetes mellitus with hyperglycemia: Secondary | ICD-10-CM

## 2022-06-29 ENCOUNTER — Encounter: Payer: Self-pay | Admitting: *Deleted

## 2022-07-07 ENCOUNTER — Encounter: Payer: Self-pay | Admitting: Internal Medicine

## 2022-07-07 ENCOUNTER — Ambulatory Visit: Payer: Medicare HMO | Attending: Internal Medicine | Admitting: Internal Medicine

## 2022-07-07 VITALS — BP 110/67 | HR 77 | Temp 98.0°F | Ht 63.0 in | Wt 121.0 lb

## 2022-07-07 DIAGNOSIS — E1159 Type 2 diabetes mellitus with other circulatory complications: Secondary | ICD-10-CM

## 2022-07-07 DIAGNOSIS — E1169 Type 2 diabetes mellitus with other specified complication: Secondary | ICD-10-CM

## 2022-07-07 DIAGNOSIS — L729 Follicular cyst of the skin and subcutaneous tissue, unspecified: Secondary | ICD-10-CM | POA: Diagnosis not present

## 2022-07-07 DIAGNOSIS — Z2821 Immunization not carried out because of patient refusal: Secondary | ICD-10-CM | POA: Diagnosis not present

## 2022-07-07 DIAGNOSIS — M810 Age-related osteoporosis without current pathological fracture: Secondary | ICD-10-CM | POA: Diagnosis not present

## 2022-07-07 DIAGNOSIS — E785 Hyperlipidemia, unspecified: Secondary | ICD-10-CM | POA: Diagnosis not present

## 2022-07-07 DIAGNOSIS — I152 Hypertension secondary to endocrine disorders: Secondary | ICD-10-CM

## 2022-07-07 DIAGNOSIS — E1165 Type 2 diabetes mellitus with hyperglycemia: Secondary | ICD-10-CM

## 2022-07-07 LAB — GLUCOSE, POCT (MANUAL RESULT ENTRY): POC Glucose: 212 mg/dl — AB (ref 70–99)

## 2022-07-07 LAB — POCT GLYCOSYLATED HEMOGLOBIN (HGB A1C): HbA1c, POC (controlled diabetic range): 9.2 % — AB (ref 0.0–7.0)

## 2022-07-07 NOTE — Patient Instructions (Signed)

## 2022-07-07 NOTE — Progress Notes (Signed)
Patient ID: Michele [name], female    DOB: 10-17-1950  MRN: [id number]  CC: Diabetes (DM f/u. Med refills. )   Subjective: Michele [name] is a 72 y.o. female who presents for chronic ds management Her concerns today include:  Patient with history of DM 2, HTN, HL, vaginal prolapse, osteoporosis on Reclast   DM: Results for orders placed or performed in visit on 07/07/22  POCT glucose (manual entry)  Result Value Ref Range   POC Glucose 212 (A) 70 - 99 mg/dl  POCT glycosylated hemoglobin (Hb A1C)  Result Value Ref Range   Hemoglobin A1C     HbA1c POC (<> result, manual entry)     HbA1c, POC (prediabetic range)     HbA1c, POC (controlled diabetic range) 9.2 (A) 0.0 - 7.0 %    A1C 9.2 A1C increased by 1 point since last visit. Checks BS but not regularly.  Does not want CGM Reports compliance with meds Januvia 50 mg daily, Metformin 1 gram BID, Glucotrol 10 mg BID Feels she does okay with eating habits.  Drinks Ginger Ale and water.  Walks 2-3x/wk  Osteoporosis:  saw Dr Leonette Monarch.  Started on Reclast.  Ist dose 06/05/2022  HTN: Reports compliance with lisinopril/HCTZ and amlodipine.  She limits salt in the foods.  No chest pains or shortness of breath. HL: Compliant with taking simvastatin.  Last LDL was 48.  Reports feeling a soft ball in the upper chest just below the breastbone.  Present x 6 months.  Does not hurt.  Has not increased in size.  HM:  declines Tdapt and PCV vaccine Patient Active Problem List   Diagnosis Date Noted   Age-related osteoporosis without current pathological fracture 07/01/2021   Hyperlipidemia associated with type 2 diabetes mellitus ([place]) 07/02/2019   COVID-19 virus vaccination declined 06/30/2019   Complete uterine prolapse 10/27/2016   Controlled type 2 diabetes mellitus without complication, without long-term current use of insulin ([place]) 09/28/2016   Hypertension associated with diabetes ([place]) 09/28/2016   Hyperlipidemia 09/28/2016    Vaginal prolapse 09/28/2016     Current Outpatient Medications on File Prior to Visit  Medication Sig Dispense Refill   amLODipine (NORVASC) 5 MG tablet Take 1 tablet (5 mg total) by mouth daily. 90 tablet 3   Blood Glucose Monitoring Suppl (TRUE METRIX METER) w/Device KIT Must keep upcoming office visit for refills 1 kit 0   calcium carbonate (CALCIUM 600) 600 MG TABS tablet Take 1 tablet (600 mg total) by mouth 2 (two) times daily with a meal. 60 tablet 3   conjugated estrogens (PREMARIN) vaginal cream Place 1 Applicatorful vaginally at bedtime. For 2 weeks then use three times a week 30 g 12   glipiZIDE (GLUCOTROL) 10 MG tablet TAKE 1 TABLET (10 MG TOTAL) TWICE DAILY. 180 tablet 2   glucose blood (ONETOUCH VERIO) test strip Must keep upcoming office visit for refills 100 strip 0   lisinopril-hydrochlorothiazide (ZESTORETIC) 20-25 MG tablet Take 1 tablet by mouth daily. 90 tablet 2   metFORMIN (GLUCOPHAGE) 1000 MG tablet Take 1 tablet (1,000 mg total) by mouth 2 (two) times daily with a meal. 180 tablet 2   omeprazole (PRILOSEC) 20 MG capsule TAKE 1 CAPSULE (20 MG TOTAL) BY MOUTH DAILY AS NEEDED (FOR ACID REFLUX). 90 capsule 1   simvastatin (ZOCOR) 20 MG tablet TAKE 1 TABLET EVERY DAY 90 tablet 0   sitaGLIPtin (JANUVIA) 50 MG tablet TAKE 1 TABLET EVERY DAY (MUST KEEP UPCOMING APPT FOR MORE REFILLS)  30 tablet 0   TRUEplus Lancets 28G MISC Use to check blood sugar twice daily. 100 each 2   Vitamin D, Cholecalciferol, 10 MCG (400 UNIT) TABS Take 10 mcg by mouth daily. 100 tablet 2   clindamycin (CLEOCIN) 300 MG capsule Take 1 capsule (300 mg total) by mouth 3 (three) times daily. (Patient not taking: Reported on 04/14/2022) 21 capsule 0   No current facility-administered medications on file prior to visit.    Allergies  Allergen Reactions   Farxiga [Dapagliflozin] Other (See Comments)    Dizzy   Januvia [Sitagliptin] Other (See Comments)    Gives heartburn and upsets her stomach     Social History   Socioeconomic History   Marital status: Married    Spouse name: Not on file   Number of children: 2   Years of education: Not on file   Highest education level: Not on file  Occupational History   Occupation: LPN  Tobacco Use   Smoking status: Never   Smokeless tobacco: Never  Substance and Sexual Activity   Alcohol use: No   Drug use: No   Sexual activity: Yes  Other Topics Concern   Not on file  Social History Narrative   Not on file   Social Determinants of Health   Financial Resource Strain: Not on file  Food Insecurity: No Food Insecurity (03/16/2022)   Hunger Vital Sign    Worried About Running Out of Food in the Last Year: Never true    Ran Out of Food in the Last Year: Never true  Transportation Needs: No Transportation Needs (03/16/2022)   PRAPARE - Hydrologist (Medical): No    Lack of Transportation (Non-Medical): No  Physical Activity: Not on file  Stress: Not on file  Social Connections: Not on file  Intimate Partner Violence: Not on file    Family History  Problem Relation Age of Onset   Diabetes Mother    Hypertension Mother    Diabetes Father    Hypertension Father    Diabetes Brother    Hypertension Brother    Breast cancer Neg Hx     Past Surgical History:  Procedure Laterality Date   CATARACT EXTRACTION, BILATERAL     08/21/2020 right, 09/11/2020 left    ROS: Review of Systems Negative except as stated above  PHYSICAL EXAM: BP 110/67 (BP Location: Left Arm, Patient Position: Sitting, Cuff Size: Normal)   Pulse 77   Temp 98 F (36.7 C) (Oral)   Ht '5\' 3"'$  (1.6 m)   Wt 121 lb (54.9 kg)   SpO2 97%   BMI 21.43 kg/m   Wt Readings from Last 3 Encounters:  07/07/22 121 lb (54.9 kg)  06/05/22 122 lb 12.8 oz (55.7 kg)  04/14/22 126 lb (57.2 kg)    Physical Exam  General appearance - alert, well appearing, older female and in no distress Mental status - normal mood, behavior, speech,  dress, motor activity, and thought processes Neck - supple, no significant adenopathy Chest - clear to auscultation, no wheezes, rales or rhonchi, symmetric air entry Heart - normal rate, regular rhythm, normal S1, S2, no murmurs, rubs, clicks or gallops Abdomen - soft, nontender, nondistended, no masses or organomegaly Extremities - peripheral pulses normal, no pedal edema, no clubbing or cyanosis Skin -small soft subcutaneous circular mass about 2 cm in size felt just a few centimeters below the xiphoid process.  Nontender to touch      Latest Ref Rng &  Units 04/14/2022   10:50 AM 11/04/2021   12:27 PM 09/20/2020   12:06 PM  CMP  Glucose 70 - 99 mg/dL 267  178  151   BUN 6 - 23 mg/dL '9  8  6   '$ Creatinine 0.40 - 1.20 mg/dL 0.67  0.56  0.57   Sodium 135 - 145 mEq/L 136  137  141   Potassium 3.5 - 5.1 mEq/L 4.1  4.5  4.2   Chloride 96 - 112 mEq/L 96  94  97   CO2 19 - 32 mEq/L '27  26  28   '$ Calcium 8.4 - 10.5 mg/dL 10.1  10.4  10.1   Total Protein 6.0 - 8.5 g/dL  7.1  7.4   Total Bilirubin 0.0 - 1.2 mg/dL  0.4  0.3   Alkaline Phos 44 - 121 IU/L  62  62   AST 0 - 40 IU/L  20  19   ALT 0 - 32 IU/L  18  16    Lipid Panel     Component Value Date/Time   CHOL 144 11/04/2021 1227   TRIG 177 (H) 11/04/2021 1227   HDL 67 11/04/2021 1227   CHOLHDL 2.1 11/04/2021 1227   LDLCALC 48 11/04/2021 1227    CBC    Component Value Date/Time   WBC 7.7 11/04/2021 1227   RBC 4.65 11/04/2021 1227   HGB 12.6 11/04/2021 1227   HCT 38.5 11/04/2021 1227   PLT 321 11/04/2021 1227   MCV 83 11/04/2021 1227   MCH 27.1 11/04/2021 1227   MCHC 32.7 11/04/2021 1227   RDW 14.0 11/04/2021 1227    ASSESSMENT AND PLAN:  1. Type 2 diabetes mellitus with hyperglycemia, without long-term current use of insulin (HCC) Not at goal and A1c has worsened. Dietary counseling given.  Advised to eliminate sugary drinks especially soft drinks.  Discussed option of adding evening dose of insulin versus stopping  Januvia and starting Jardiance instead.  Patient declines any medication changes. She declines getting a continuous glucose monitor. Agreeable to seeing Dr. Leonette Monarch to assist [place] with management of her diabetes - POCT glucose (manual entry) - POCT glycosylated hemoglobin (Hb A1C)  2. Hypertension associated with diabetes ([place]) At goal.  Continue lisinopril/HCTZ and Norvasc at current doses.  3. Hyperlipidemia associated with type 2 diabetes mellitus ([place]) Controlled on simvastatin.  4. Age-related osteoporosis without current pathological fracture She has seen the endocrinologist.  Started on Reclast.  5. Subcutaneous cyst Observe for now.  Let me know if this increases in size.  6. Pneumococcal vaccination declined   7. Tetanus, diphtheria, and acellular pertussis (Tdap) vaccination declined    Patient was given the opportunity to ask questions.  Patient verbalized understanding of the plan and was able to repeat key elements of the plan.   This documentation was completed using Radio producer.  Any transcriptional errors are unintentional.  Orders Placed This Encounter  Procedures   POCT glucose (manual entry)   POCT glycosylated hemoglobin (Hb A1C)     Requested Prescriptions    No prescriptions requested or ordered in this encounter    Return in about 4 months (around 11/05/2022) for Give Medicare Wellness Visit with [place] in 2 wks.  Karle Plumber, MD, FACP

## 2022-07-11 ENCOUNTER — Other Ambulatory Visit: Payer: Self-pay | Admitting: Internal Medicine

## 2022-07-11 DIAGNOSIS — E1165 Type 2 diabetes mellitus with hyperglycemia: Secondary | ICD-10-CM

## 2022-07-31 ENCOUNTER — Ambulatory Visit: Payer: Medicare HMO | Attending: Internal Medicine

## 2022-07-31 DIAGNOSIS — Z0000 Encounter for general adult medical examination without abnormal findings: Secondary | ICD-10-CM

## 2022-07-31 NOTE — Progress Notes (Signed)
Subjective:   Michele [name] is a 72 y.o. female who presents for Medicare Annual (Subsequent) preventive examination.  Review of Systems     I connected with Michele [name] on 07/31/2022 at 10:44 am by telephone and verified that I am speaking with the correct person using two identifiers. I discussed the limitations, risks, security and privacy concerns of performing an evaluation and management service by telephone and the availability of in person appointments. I also discussed with the patient that there may be a patient responsible charge related to this service. The patient expressed understanding and agreed to proceed.   Patient location: Home My Location: [place] on the telephone call: Myself and Michele [name]     Cardiac Risk Factors include: none     Objective:    There were no vitals filed for this visit. There is no height or weight on file to calculate BMI.     07/31/2022   10:48 AM 12/07/2016    3:44 PM  Advanced Directives  Does Patient Have a Medical Advance Directive? No No  Would patient like information on creating a medical advance directive? Yes (ED - Information included in AVS) No - Patient declined    Current Medications (verified) Outpatient Encounter Medications as of 07/31/2022  Medication Sig   amLODipine (NORVASC) 5 MG tablet Take 1 tablet (5 mg total) by mouth daily.   Blood Glucose Monitoring Suppl (TRUE METRIX METER) w/Device KIT Must keep upcoming office visit for refills   calcium carbonate (CALCIUM 600) 600 MG TABS tablet Take 1 tablet (600 mg total) by mouth 2 (two) times daily with a meal.   conjugated estrogens (PREMARIN) vaginal cream Place 1 Applicatorful vaginally at bedtime. For 2 weeks then use three times a week   glipiZIDE (GLUCOTROL) 10 MG tablet TAKE 1 TABLET (10 MG TOTAL) TWICE DAILY.   glucose blood (ONETOUCH VERIO) test strip Must keep upcoming office visit for refills   lisinopril-hydrochlorothiazide  (ZESTORETIC) 20-25 MG tablet Take 1 tablet by mouth daily.   metFORMIN (GLUCOPHAGE) 1000 MG tablet Take 1 tablet (1,000 mg total) by mouth 2 (two) times daily with a meal.   omeprazole (PRILOSEC) 20 MG capsule TAKE 1 CAPSULE (20 MG TOTAL) BY MOUTH DAILY AS NEEDED (FOR ACID REFLUX).   simvastatin (ZOCOR) 20 MG tablet TAKE 1 TABLET EVERY DAY   sitaGLIPtin (JANUVIA) 50 MG tablet Take 1 tablet (50 mg total) by mouth daily.   TRUEplus Lancets 28G MISC Use to check blood sugar twice daily.   Vitamin D, Cholecalciferol, 10 MCG (400 UNIT) TABS Take 10 mcg by mouth daily.   clindamycin (CLEOCIN) 300 MG capsule Take 1 capsule (300 mg total) by mouth 3 (three) times daily.   No facility-administered encounter medications on file as of 07/31/2022.    Allergies (verified) Farxiga [dapagliflozin] and Januvia [sitagliptin]   History: Past Medical History:  Diagnosis Date   Diabetes mellitus without complication ([place])    Hypertension    Past Surgical History:  Procedure Laterality Date   CATARACT EXTRACTION, BILATERAL     08/21/2020 right, 09/11/2020 left   Family History  Problem Relation Age of Onset   Diabetes Mother    Hypertension Mother    Diabetes Father    Hypertension Father    Diabetes Brother    Hypertension Brother    Breast cancer Neg Hx    Social History   Socioeconomic History   Marital status: Married    Spouse name: Not on file  Number of children: 2   Years of education: Not on file   Highest education level: Not on file  Occupational History   Occupation: LPN  Tobacco Use   Smoking status: Never   Smokeless tobacco: Never  Vaping Use   Vaping Use: Never used  Substance and Sexual Activity   Alcohol use: No   Drug use: No   Sexual activity: Yes  Other Topics Concern   Not on file  Social History Narrative   Not on file   Social Determinants of Health   Financial Resource Strain: Low Risk  (07/31/2022)   Overall Financial Resource Strain (CARDIA)     Difficulty of Paying Living Expenses: Not hard at all  Food Insecurity: No Food Insecurity (07/31/2022)   Hunger Vital Sign    Worried About Running Out of Food in the Last Year: Never true    [place] in the Last Year: Never true  Transportation Needs: No Transportation Needs (07/31/2022)   PRAPARE - Hydrologist (Medical): No    Lack of Transportation (Non-Medical): No  Physical Activity: Insufficiently Active (07/31/2022)   Exercise Vital Sign    Days of Exercise per Week: 2 days    Minutes of Exercise per Session: 10 min  Stress: No Stress Concern Present (07/31/2022)   [place]    Feeling of Stress : Not at all  Social Connections: Moderately Integrated (07/31/2022)   Social Connection and Isolation Panel [NHANES]    Frequency of Communication with Friends and Family: More than three times a week    Frequency of Social Gatherings with Friends and Family: Once a week    Attends Religious Services: 1 to 4 times per year    Active Member of [employer] or Organizations: No    Attends Music therapist: Never    Marital Status: Married    Tobacco Counseling Counseling given: Not Answered   Clinical Intake:  Pre-visit preparation completed: No  Pain : No/denies pain     Nutritional Risks: None Diabetes: Yes CBG done?: No  How often do you need to have someone help you when you read instructions, pamphlets, or other written materials from your doctor or pharmacy?: 1 - Never  Diabetic?Nutrition Risk Assessment:  Has the patient had any N/V/D within the last 2 months?  No  Does the patient have any non-healing wounds?  No  Has the patient had any unintentional weight loss or weight gain?  No   Diabetes:  Is the patient diabetic?  Yes  If diabetic, was a CBG obtained today?  Yes  Did the patient bring in their glucometer from home?  No  How often do you monitor  your CBG's? TID.   Financial Strains and Diabetes Management:  Are you having any financial strains with the device, your supplies or your medication? No .  Does the patient want to be seen by Chronic Care Management for management of their diabetes?  No  Would the patient like to be referred to a Nutritionist or for Diabetic Management?  No   Diabetic Exams:  Diabetic Eye Exam: Completed 01/15/2022 Diabetic Foot Exam: Overdue, Pt has been advised about the importance in completing this exam. Pt is scheduled for diabetic foot exam on Next office visit.   Interpreter Needed?: No      Activities of Daily Living    07/31/2022   10:48 AM  In your present state  of health, do you have any difficulty performing the following activities:  Hearing? 0  Vision? 0  Difficulty concentrating or making decisions? 0  Walking or climbing stairs? 0  Dressing or bathing? 0  Doing errands, shopping? 0  Preparing Food and eating ? N  Using the Toilet? N  In the past six months, have you accidently leaked urine? N  Do you have problems with loss of bowel control? N  Managing your Medications? N  Managing your Finances? N  Housekeeping or managing your Housekeeping? N    Patient Care Team: Ladell Pier, MD as PCP - General (Internal Medicine)  Indicate any recent Medical Services you may have received from other than [facility] providers in the past year (date may be approximate).     Assessment:   This is a routine wellness examination for Jasline.  Hearing/Vision screen No results found.  Dietary issues and exercise activities discussed: Current Exercise Habits: The patient does not participate in regular exercise at present, Exercise limited by: None identified   Goals Addressed   None    Depression Screen    07/31/2022   10:48 AM 07/07/2022   12:13 PM 03/16/2022    2:03 PM 03/06/2022   11:53 AM 07/01/2021    2:21 PM 09/20/2020   11:34 AM 12/08/2019    2:12 PM  PHQ 2/9 Scores   PHQ - 2 Score 0 0 0 0 0 0 0  PHQ- 9 Score  0 0        Fall Risk    07/31/2022   10:48 AM 07/07/2022   12:08 PM 03/06/2022   11:53 AM 07/01/2021    2:21 PM 09/20/2020   11:33 AM  Fall Risk   Falls in the past year? 0 0 0 0 0  Number falls in past yr: 0 0 0 0 0  Injury with Fall? 0 0 0 0 0  Risk for fall due to : No Fall Risks No Fall Risks No Fall Risks No Fall Risks No Fall Risks  Follow up Falls prevention discussed        FALL RISK PREVENTION PERTAINING TO THE HOME:  Any stairs in or around the home? No  If so, are there any without handrails? No  Home free of loose throw rugs in walkways, pet beds, electrical cords, etc? Yes  Adequate lighting in your home to reduce risk of falls? Yes   ASSISTIVE DEVICES UTILIZED TO PREVENT FALLS:  Life alert? No  Use of a cane, walker or w/c? No  Grab bars in the bathroom? No  Shower chair or bench in shower? No  Elevated toilet seat or a handicapped toilet? No   TIMED UP AND GO:  Was the test performed? No .  Length of time to ambulate 10 feet: N/A sec.   Gait slow and steady without use of assistive device  Cognitive Function:    07/31/2022   10:49 AM  MMSE - Mini Mental State Exam  Orientation to time 5  Orientation to Place 5  Registration 3  Attention/ Calculation 5  Recall 3  Language- name 2 objects 2  Language- repeat 1  Language- follow 3 step command 3  Language- read & follow direction 1  Write a sentence 1  Copy design 1  Total score 30        07/31/2022   10:50 AM  6CIT Screen  What Year? 0 points  What month? 0 points  What time? 0 points  Count  back from 20 0 points  Months in reverse 0 points  Repeat phrase 0 points  Total Score 0 points    Immunizations Immunization History  Administered Date(s) Administered   Influenza,inj,Quad PF,6+ Mos 03/02/2017, 02/25/2018   PPD Test 07/19/2017    TDAP status: Due, Education has been provided regarding the importance of this vaccine. Advised may  receive this vaccine at local pharmacy or Health Dept. Aware to provide a copy of the vaccination record if obtained from local pharmacy or Health Dept. Verbalized acceptance and understanding. PATIENT DECLINED  Flu Vaccine status: Declined, Education has been provided regarding the importance of this vaccine but patient still declined. Advised may receive this vaccine at local pharmacy or Health Dept. Aware to provide a copy of the vaccination record if obtained from local pharmacy or Health Dept. Verbalized acceptance and understanding.  Pneumococcal vaccine status: Declined,  Education has been provided regarding the importance of this vaccine but patient still declined. Advised may receive this vaccine at local pharmacy or Health Dept. Aware to provide a copy of the vaccination record if obtained from local pharmacy or Health Dept. Verbalized acceptance and understanding.   Covid-19 vaccine status: Declined, Education has been provided regarding the importance of this vaccine but patient still declined. Advised may receive this vaccine at local pharmacy or Health Dept.or vaccine clinic. Aware to provide a copy of the vaccination record if obtained from local pharmacy or Health Dept. Verbalized acceptance and understanding.  Qualifies for Shingles Vaccine? Yes   Zostavax completed No   Shingrix Completed?: No.    Education has been provided regarding the importance of this vaccine. Patient has been advised to call insurance company to determine out of pocket expense if they have not yet received this vaccine. Advised may also receive vaccine at local pharmacy or Health Dept. Verbalized acceptance and understanding.PATIENT DECLINED  Screening Tests Health Maintenance  Topic Date Due   Medicare Annual Wellness (AWV)  Never done   FOOT EXAM  07/01/2022   INFLUENZA VACCINE  08/09/2022 (Originally 12/09/2021)   COVID-19 Vaccine (1) 03/08/2023 (Originally 07/13/1951)   Zoster Vaccines- Shingrix (1 of 2)  03/08/2023 (Originally 01/13/2002)   Pneumonia Vaccine 69+ Years old (1 of 1 - PCV) 07/08/2023 (Originally 01/13/2017)   Diabetic kidney evaluation - Urine ACR  11/05/2022   HEMOGLOBIN A1C  01/05/2023   OPHTHALMOLOGY EXAM  01/16/2023   Diabetic kidney evaluation - eGFR measurement  04/15/2023   MAMMOGRAM  08/15/2023   Fecal DNA (Cologuard)  04/09/2025   DEXA SCAN  Completed   Hepatitis C Screening  Completed   HPV VACCINES  Aged Out   DTaP/Tdap/Td  Discontinued    Health Maintenance  Health Maintenance Due  Topic Date Due   Medicare Annual Wellness (AWV)  Never done   FOOT EXAM  07/01/2022    Colorectal cancer screening: Type of screening: Cologuard. Completed 04/09/2022. Repeat every 3 years  Mammogram status: Completed 08/14/2021. Repeat every year  Bone Density status: Completed 11/07/2020. Results reflect: Bone density results: OSTEOPOROSIS. Repeat every 2 years.  Lung Cancer Screening: (Low Dose CT Chest recommended if Age 34-80 years, 30 pack-year currently smoking OR have quit w/in 15years.) does not qualify.   Lung Cancer Screening Referral: N/A  Additional Screening:  Hepatitis C Screening: does qualify; Completed 09/29/2016  Vision Screening: Recommended annual ophthalmology exams for early detection of glaucoma and other disorders of the eye. Is the patient up to date with their annual eye exam?  Yes  Who is the  provider or what is the name of the office in which the patient attends annual eye exams? [facility] If pt is not established with a provider, would they like to be referred to a provider to establish care? No .   Dental Screening: Recommended annual dental exams for proper oral hygiene  Community Resource Referral / Chronic Care Management: CRR required this visit?  No   CCM required this visit?  No      Plan:     I have personally reviewed and noted the following in the patient's chart:   Medical and social history Use of alcohol, tobacco or  illicit drugs  Current medications and supplements including opioid prescriptions. Patient is not currently taking opioid prescriptions. Functional ability and status Nutritional status Physical activity Advanced directives List of other physicians Hospitalizations, surgeries, and ER visits in previous 12 months Vitals Screenings to include cognitive, depression, and falls Referrals and appointments  In addition, I have reviewed and discussed with patient certain preventive protocols, quality metrics, and best practice recommendations. A written personalized care plan for preventive services as well as general preventive health recommendations were provided to patient.     Gomez Cleverly, [place]   07/31/2022   Nurse Notes: I spent 20 minutes on this telephone encounter AVS mailed to Michele [name]

## 2022-07-31 NOTE — Patient Instructions (Addendum)
Michele [name] , Thank you for taking time to come for your Medicare Wellness Visit. I appreciate your ongoing commitment to your health goals. Please review the following plan we discussed and let me know if I can assist you in the future.   These are the goals we discussed:  Goals   None     This is a list of the screening recommended for you and due dates:  Health Maintenance  Topic Date Due   Complete foot exam   07/01/2022   Flu Shot  08/09/2022*   COVID-19 Vaccine (1) 03/08/2023*   Zoster (Shingles) Vaccine (1 of 2) 03/08/2023*   Pneumonia Vaccine (1 of 1 - PCV) 07/08/2023*   Yearly kidney health urinalysis for diabetes  11/05/2022   Hemoglobin A1C  01/05/2023   Eye exam for diabetics  01/16/2023   Yearly kidney function blood test for diabetes  04/15/2023   Medicare Annual Wellness Visit  07/31/2023   Mammogram  08/15/2023   Cologuard (Stool DNA test)  04/09/2025   DEXA scan (bone density measurement)  Completed   Hepatitis C Screening: USPSTF Recommendation to screen - Ages 2-79 yo.  Completed   HPV Vaccine  Aged Out   DTaP/Tdap/Td vaccine  Discontinued  *Topic was postponed. The date shown is not the original due date.   Health Maintenance After Age 70 After age 41, you are at a higher risk for certain long-term diseases and infections as well as injuries from falls. Falls are a major cause of broken bones and head injuries in people who are older than age 91. Getting regular preventive care can help to keep you healthy and well. Preventive care includes getting regular testing and making lifestyle changes as recommended by your health care provider. Talk with your health care provider about: Which screenings and tests you should have. A screening is a test that checks for a disease when you have no symptoms. A diet and exercise plan that is right for you. What should I know about screenings and tests to prevent falls? Screening and testing are the best ways to find a  health problem early. Early diagnosis and treatment give you the best chance of managing medical conditions that are common after age 31. Certain conditions and lifestyle choices may make you more likely to have a fall. Your health care provider may recommend: Regular vision checks. Poor vision and conditions such as cataracts can make you more likely to have a fall. If you wear glasses, make sure to get your prescription updated if your vision changes. Medicine review. Work with your health care provider to regularly review all of the medicines you are taking, including over-the-counter medicines. Ask your health care provider about any side effects that may make you more likely to have a fall. Tell your health care provider if any medicines that you take make you feel dizzy or sleepy. Strength and balance checks. Your health care provider may recommend certain tests to check your strength and balance while standing, walking, or changing positions. Foot health exam. Foot pain and numbness, as well as not wearing proper footwear, can make you more likely to have a fall. Screenings, including: Osteoporosis screening. Osteoporosis is a condition that causes the bones to get weaker and break more easily. Blood pressure screening. Blood pressure changes and medicines to control blood pressure can make you feel dizzy. Depression screening. You may be more likely to have a fall if you have a fear of falling, feel depressed, or feel  unable to do activities that you used to do. Alcohol use screening. Using too much alcohol can affect your balance and may make you more likely to have a fall. Follow these instructions at home: Lifestyle Do not drink alcohol if: Your health care provider tells you not to drink. If you drink alcohol: Limit how much you have to: 0-1 drink a day for women. 0-2 drinks a day for men. Know how much alcohol is in your drink. In the [place] one drink equals one 12 oz bottle of beer  (355 mL), one 5 oz glass of wine (148 mL), or one 1 oz glass of hard liquor (44 mL). Do not use any products that contain nicotine or tobacco. These products include cigarettes, chewing tobacco, and vaping devices, such as e-cigarettes. If you need help quitting, ask your health care provider. Activity  Follow a regular exercise program to stay fit. This will help you maintain your balance. Ask your health care provider what types of exercise are appropriate for you. If you need a cane or walker, use it as recommended by your health care provider. Wear supportive shoes that have nonskid soles. Safety  Remove any tripping hazards, such as rugs, cords, and clutter. Install safety equipment such as grab bars in bathrooms and safety rails on stairs. Keep rooms and walkways well-lit. General instructions Talk with your health care provider about your risks for falling. Tell your health care provider if: You fall. Be sure to tell your health care provider about all falls, even ones that seem minor. You feel dizzy, tiredness (fatigue), or off-balance. Take over-the-counter and prescription medicines only as told by your health care provider. These include supplements. Eat a healthy diet and maintain a healthy weight. A healthy diet includes low-fat dairy products, low-fat (lean) meats, and fiber from whole grains, beans, and lots of fruits and vegetables. Stay current with your vaccines. Schedule regular health, dental, and eye exams. Summary Having a healthy lifestyle and getting preventive care can help to protect your health and wellness after age 32. Screening and testing are the best way to find a health problem early and help you avoid having a fall. Early diagnosis and treatment give you the best chance for managing medical conditions that are more common for people who are older than age 12. Falls are a major cause of broken bones and head injuries in people who are older than age 61. Take  precautions to prevent a fall at home. Work with your health care provider to learn what changes you can make to improve your health and wellness and to prevent falls. This information is not intended to replace advice given to you by your health care provider. Make sure you discuss any questions you have with your health care provider. Document Revised: 09/16/2020 Document Reviewed: 09/16/2020 Elsevier Patient Education  [place].

## 2022-08-07 ENCOUNTER — Ambulatory Visit: Payer: Medicare HMO | Admitting: Obstetrics and Gynecology

## 2022-08-11 NOTE — Progress Notes (Signed)
complete

## 2022-08-11 NOTE — Addendum Note (Signed)
Addended by: Gomez Cleverly on: 08/11/2022 10:37 AM   Modules accepted: Level of Service

## 2022-09-07 ENCOUNTER — Ambulatory Visit: Payer: Medicare HMO | Admitting: Obstetrics and Gynecology

## 2022-09-07 ENCOUNTER — Encounter: Payer: Self-pay | Admitting: Obstetrics and Gynecology

## 2022-09-07 VITALS — BP 113/71 | HR 79 | Ht 61.5 in | Wt 119.4 lb

## 2022-09-07 DIAGNOSIS — N812 Incomplete uterovaginal prolapse: Secondary | ICD-10-CM | POA: Diagnosis not present

## 2022-09-07 DIAGNOSIS — R350 Frequency of micturition: Secondary | ICD-10-CM

## 2022-09-07 DIAGNOSIS — N8110 Cystocele, unspecified: Secondary | ICD-10-CM

## 2022-09-07 LAB — POCT URINALYSIS DIPSTICK
Bilirubin, UA: NEGATIVE
Blood, UA: NEGATIVE
Glucose, UA: POSITIVE — AB
Ketones, UA: NEGATIVE
Leukocytes, UA: NEGATIVE
Nitrite, UA: NEGATIVE
Protein, UA: NEGATIVE
Spec Grav, UA: >=1.03 — AB (ref 1.010–1.025)
Urobilinogen, UA: 0.2 E.U./dL
pH, UA: 6 (ref 5.0–8.0)

## 2022-09-07 NOTE — Progress Notes (Signed)
[facility] New Patient Evaluation and Consultation  Referring Provider: Tereso Newcomer, MD PCP: Marcine Matar, MD Date of Service: 09/07/2022  SUBJECTIVE Chief Complaint: New Patient (Initial Visit) (Dajanae Brophy Bahner is a 72 y.o. female for a consult for prolapse./)  History of Present Illness: JAYELLE PAGE is a 72 y.o. White or Caucasian female seen in consultation at the request of Dr. Macon Large for evaluation of prolapse.    Review of records significant for: Using a #2 ring with support pessary. Has been using estrogen cream.   Last Hgb A1c on 07/07/22- 9.2%  Urinary Symptoms: Does not leak urine.   Day time voids 4.  Nocturia: 2 times per night to void. Voiding dysfunction: she empties her bladder well.  does not use a catheter to empty bladder.  When urinating, she feels dribbling after finishing  UTIs:  0  UTI's in the last year.   Denies history of blood in urine and kidney or bladder stones  Pelvic Organ Prolapse Symptoms:                  She Admits to a feeling of a bulge the vaginal area. It has been present for years, has been stable.  She Admits to seeing a bulge.  This bulge is bothersome. Currently using a #2 ring with support pessary but it is cracked. For now, she is happy with the pessary.  She is not using the vaginal estrogen cream regularly.   Bowel Symptom: Bowel movements: 2 time(s) per day Stool consistency: soft  Straining: no.  Splinting: no.  Incomplete evacuation: no.  She Denies accidental bowel leakage / fecal incontinence Bowel regimen: none   Sexual Function Sexually active: no.   Pelvic Pain Denies pelvic pain   Past Medical History:  Past Medical History:  Diagnosis Date   Diabetes mellitus without complication (HCC)    Hypertension      Past Surgical History:   Past Surgical History:  Procedure Laterality Date   CATARACT EXTRACTION, BILATERAL     08/21/2020 right, 09/11/2020 left     Past  OB/GYN History: OB History  Gravida Para Term Preterm AB Living  4 4 4     4   SAB IAB Ectopic Multiple Live Births          4    # Outcome Date GA Lbr Len/2nd Weight Sex Delivery Anes PTL Lv  4 Term      Vag-Spont   LIV  3 Term      Vag-Vacuum   LIV  2 Term      Vag-Spont   LIV  1 Term      Vag-Spont   LIV    Menopausal: Denies vaginal bleeding since menopause Last pap smear- pt reports 2022.  Any history of abnormal pap smears: no.   Medications: She has a current medication list which includes the following prescription(s): amlodipine, true metrix meter, calcium carbonate, premarin, glipizide, onetouch verio, lisinopril-hydrochlorothiazide, metformin, omeprazole, simvastatin, sitagliptin, trueplus lancets 28g, and vitamin d (cholecalciferol).   Allergies: Patient is allergic to farxiga [dapagliflozin] and januvia [sitagliptin].   Social History:  Social History   Tobacco Use   Smoking status: Never   Smokeless tobacco: Never  Vaping Use   Vaping Use: Never used  Substance Use Topics   Alcohol use: No   Drug use: No    Relationship status: married She lives with husband.   She is not employed. Regular exercise: Yes: walking History of abuse:  No  Family History:   Family History  Problem Relation Age of Onset   Diabetes Mother    Hypertension Mother    Diabetes Father    Hypertension Father    Diabetes Brother    Hypertension Brother    Breast cancer Neg Hx      Review of Systems: Review of Systems  Constitutional:  Negative for fever, malaise/fatigue and weight loss.  Respiratory:  Negative for cough, shortness of breath and wheezing.   Cardiovascular:  Negative for chest pain, palpitations and leg swelling.  Gastrointestinal:  Negative for abdominal pain and blood in stool.  Genitourinary:  Negative for dysuria.  Musculoskeletal:  Negative for myalgias.  Skin:  Negative for rash.  Neurological:  Negative for dizziness and headaches.   Endo/Heme/Allergies:  Does not bruise/bleed easily.  Psychiatric/Behavioral:  Negative for depression. The patient is not nervous/anxious.      OBJECTIVE Physical Exam: Vitals:   09/07/22 1316  BP: 113/71  Pulse: 79  Weight: 119 lb 6.4 oz (54.2 kg)  Height: 5' 1.5" (1.562 m)    Physical Exam Constitutional:      General: She is not in acute distress. Pulmonary:     Effort: Pulmonary effort is normal.  Abdominal:     General: There is no distension.     Palpations: Abdomen is soft.     Tenderness: There is no abdominal tenderness. There is no rebound.  Musculoskeletal:        General: No swelling. Normal range of motion.  Skin:    General: Skin is warm and dry.     Findings: No rash.  Neurological:     Mental Status: She is alert and oriented to person, place, and time.  Psychiatric:        Mood and Affect: Mood normal.        Behavior: Behavior normal.      GU / Detailed Urogynecologic Evaluation:  Pelvic Exam: Normal external female genitalia; Bartholin's and Skene's glands normal in appearance; urethral meatus normal in appearance, no urethral masses or discharge.   CST: negative  Speculum exam reveals normal vaginal mucosa with atrophy. Cervix normal appearance. Uterus normal single, nontender. Adnexa no mass, fullness, tenderness.    Pelvic floor strength II/V  Pelvic floor musculature: Right levator non-tender, Right obturator non-tender, Left levator non-tender, Left obturator non-tender  POP-Q:   POP-Q  1                                            Aa   1                                           Ba  -4                                              C   4                                            Gh  3.5  Pb  6                                            tvl   -2.5                                            Ap  -2.5                                            Bp  -4                                               D      Rectal Exam:  Normal external rectum  Post-Void Residual (PVR) by Bladder Scan: In order to evaluate bladder emptying, we discussed obtaining a postvoid residual and she agreed to this procedure.  Procedure: The ultrasound unit was placed on the patient's abdomen in the suprapubic region after the patient had voided. A PVR of 0 ml was obtained by bladder scan.  Laboratory Results: POC urine: negative  ASSESSMENT AND PLAN Ms. Musquiz is a 72 y.o. with:  1. Prolapse of anterior vaginal wall   2. Uterovaginal prolapse, incomplete   3. Urinary frequency    Stage II anterior, Stage I posterior, Stage I apical prolapse - For treatment of pelvic organ prolapse, we discussed options for management including expectant management, conservative management, and surgical management, such as Kegels, a pessary, pelvic floor physical therapy, and specific surgical procedures. - for now, she is taking care of her husband and does not want any surgery. But that may be an option for her in the future. We briefly discussed surgical options including mesh, vaginal native tissue and colpocleisis.  - Current pessary has small tear in the membrane but still usable for now. Do not have a #2 ring with support pessary in stock but will order it for her. She will continue to use the one she has. Remove weekly.   Can return 6 months for pessary check or sooner if needed   Marguerita Beards, MD

## 2022-09-25 ENCOUNTER — Other Ambulatory Visit: Payer: Self-pay | Admitting: Internal Medicine

## 2022-09-25 DIAGNOSIS — E1165 Type 2 diabetes mellitus with hyperglycemia: Secondary | ICD-10-CM

## 2022-09-28 NOTE — Telephone Encounter (Signed)
Requested Prescriptions  Pending Prescriptions Disp Refills   glipiZIDE (GLUCOTROL) 10 MG tablet [Pharmacy Med Name: GLIPIZIDE 10 MG Tablet] 180 tablet 1    Sig: TAKE 1 TABLET TWICE DAILY     Endocrinology:  Diabetes - Sulfonylureas Failed - 09/25/2022  7:49 PM      Failed - HBA1C is between 0 and 7.9 and within 180 days    HbA1c, POC (controlled diabetic range)  Date Value Ref Range Status  07/07/2022 9.2 (A) 0.0 - 7.0 % Final         Passed - Cr in normal range and within 360 days    Creatinine, Ser  Date Value Ref Range Status  04/14/2022 0.67 0.40 - 1.20 mg/dL Final         Passed - Valid encounter within last 6 months    Recent Outpatient Visits           2 months ago Type 2 diabetes mellitus with hyperglycemia, without long-term current use of insulin (HCC)   [place] [facility] Jonah Blue B, MD   6 months ago Type 2 diabetes mellitus with hyperglycemia, without long-term current use of insulin [facility])   [place] [facility] Jonah Blue B, MD   10 months ago Type 2 diabetes mellitus with hyperglycemia, without long-term current use of insulin [facility])   [place] [facility] Jonah Blue B, MD   1 year ago Type 2 diabetes mellitus with hyperglycemia, without long-term current use of insulin [facility])   [place] [facility] Jonah Blue B, MD   2 years ago Type 2 diabetes mellitus with hyperglycemia, without long-term current use of insulin [facility])   [place] [facility] Marcine Matar, MD       Future Appointments             In 1 month Laural Benes, Binnie Rail, MD [facility]   In 5 months Florian Buff, Rochel Brome, MD [facility]

## 2022-10-05 IMAGING — MG MM DIGITAL SCREENING BILAT W/ TOMO AND CAD
8 series · 8 of 24 positions shown · non-contrast
Comparison: None.

CLINICAL DATA: Screening.

EXAM:
DIGITAL SCREENING BILATERAL MAMMOGRAM WITH TOMOSYNTHESIS AND CAD
TECHNIQUE: Bilateral screening digital craniocaudal and mediolateral oblique
mammograms were obtained. Bilateral screening digital breast
tomosynthesis was performed. The images were evaluated with
computer-aided detection.

[R MLO synth-2D]
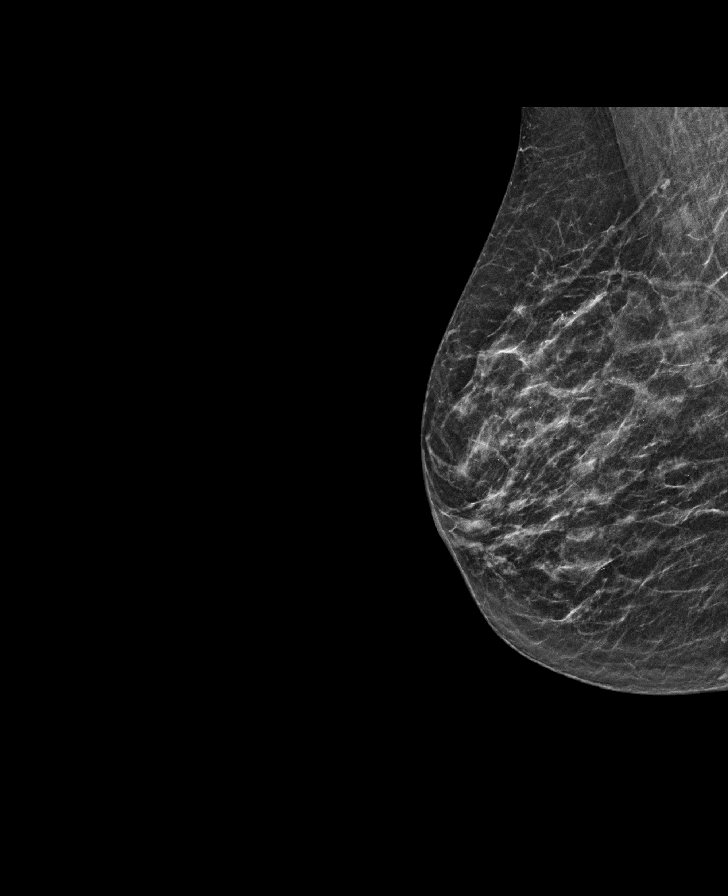

[L CC synth-2D]
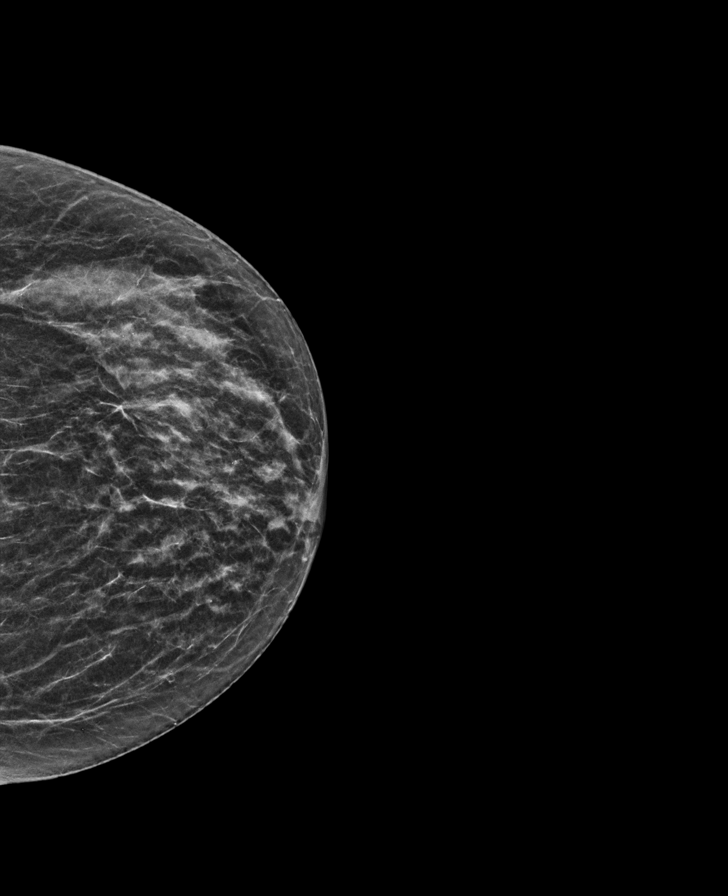

[R CC synth-2D]
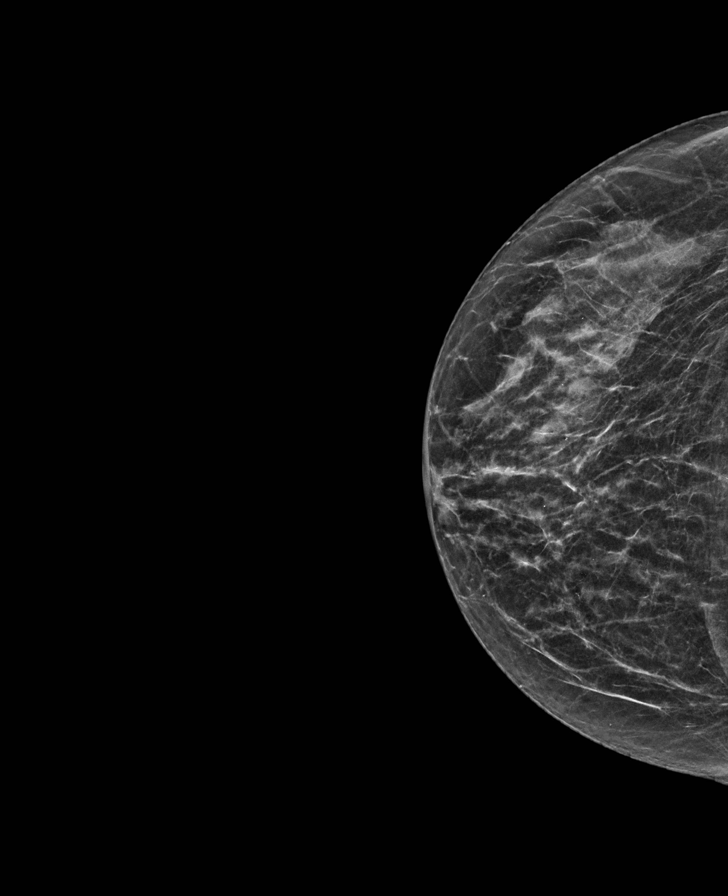

[L MLO synth-2D]
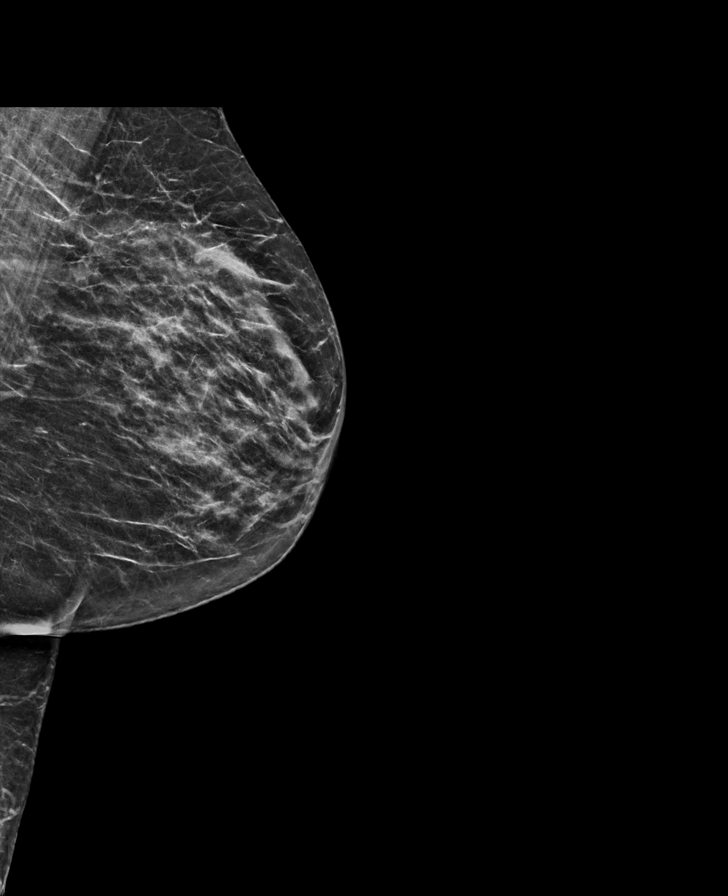

[R CC tomo · tomo slice 25/48.0]
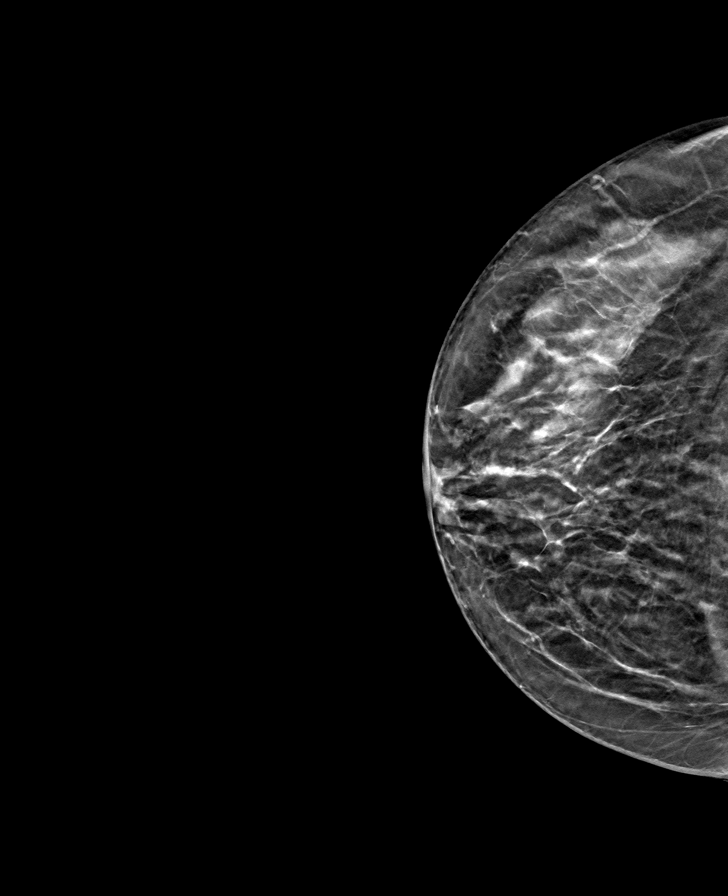

[R MLO tomo · tomo slice 24/47.0]
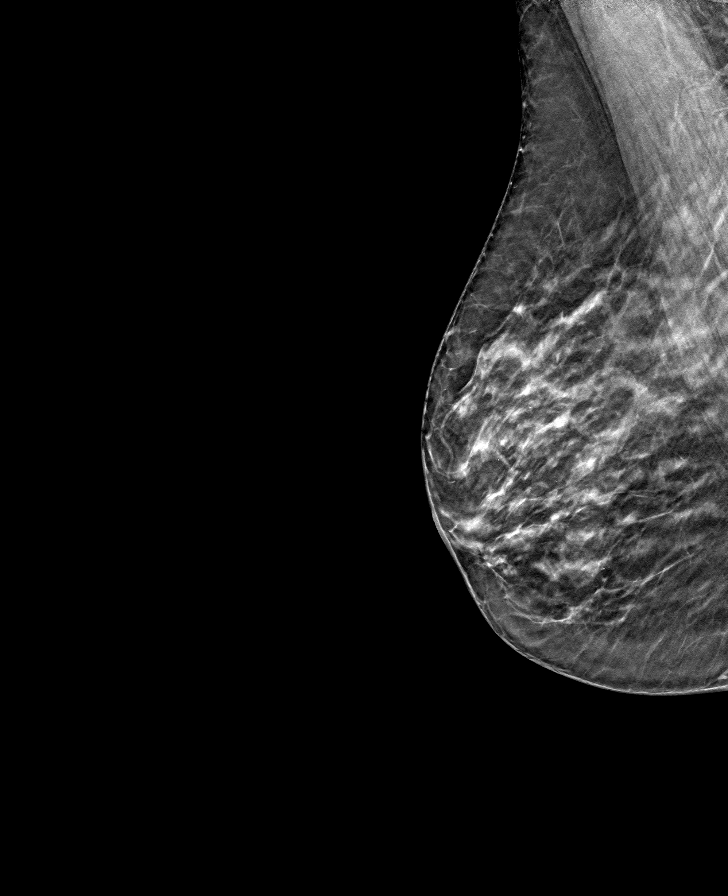

[L MLO tomo · tomo slice 27/52.0]
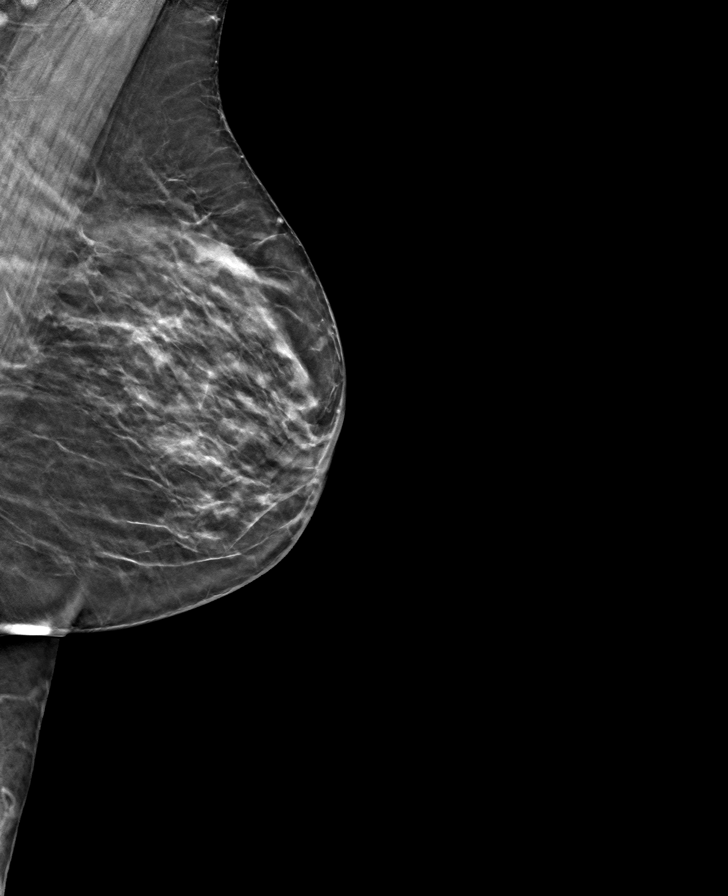

[L CC tomo · tomo slice 25/48.0]
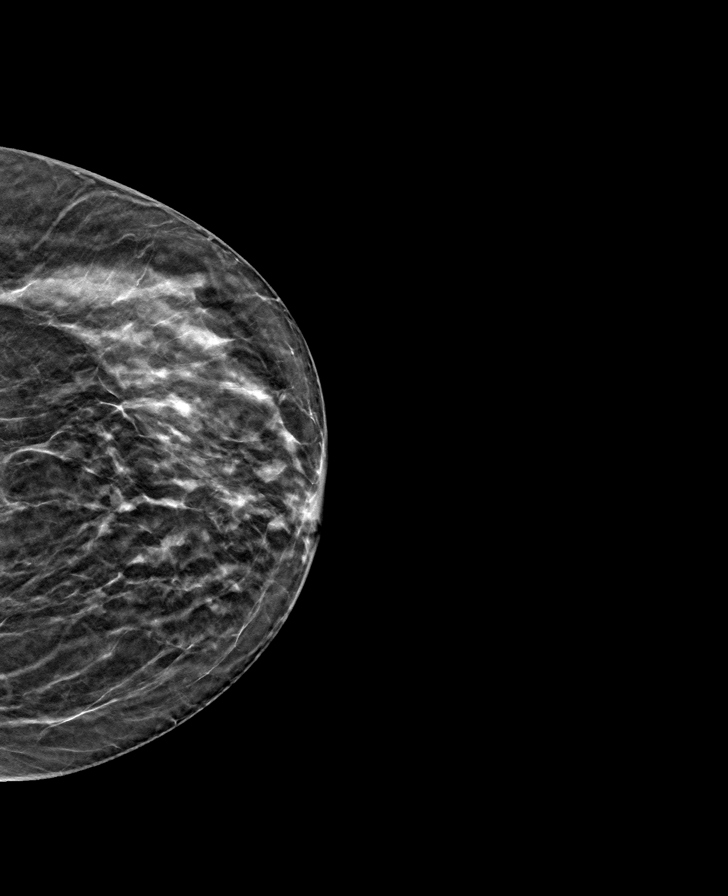

[8 of 24 positions shown; findings below may reference images not displayed]

ACR Breast Density Category c: The breast tissue is heterogeneously
dense, which may obscure small masses
FINDINGS: There are no findings suspicious for malignancy.
IMPRESSION: No mammographic evidence of malignancy. A result letter of this
screening mammogram will be mailed directly to the patient.

RECOMMENDATION:
Screening mammogram in one year. (Code:C8-T-HNK)

BI-RADS CATEGORY  1: Negative.

## 2022-10-19 ENCOUNTER — Ambulatory Visit: Payer: Medicare HMO | Admitting: Internal Medicine

## 2022-10-19 ENCOUNTER — Encounter: Payer: Self-pay | Admitting: Internal Medicine

## 2022-10-19 VITALS — BP 110/66 | HR 86 | Ht 61.5 in | Wt 121.0 lb

## 2022-10-19 DIAGNOSIS — M810 Age-related osteoporosis without current pathological fracture: Secondary | ICD-10-CM

## 2022-10-19 MED ORDER — ANTICOAGULANT SODIUM CITRATE 4% (200MG/5ML) IV SOLN
5.0000 mL | Freq: Once | Status: DC | PRN
Start: 2022-10-19 — End: 2022-10-19

## 2022-10-19 MED ORDER — DIPHENHYDRAMINE HCL 25 MG PO CAPS
25.0000 mg | ORAL_CAPSULE | Freq: Once | ORAL | Status: DC
Start: 2022-10-19 — End: 2022-10-19

## 2022-10-19 MED ORDER — SODIUM CHLORIDE 0.9% FLUSH
10.0000 mL | Freq: Once | INTRAVENOUS | Status: DC | PRN
Start: 2022-10-19 — End: 2022-10-19

## 2022-10-19 MED ORDER — HEPARIN SOD (PORK) LOCK FLUSH 100 UNIT/ML IV SOLN
250.0000 [IU] | Freq: Once | INTRAVENOUS | Status: DC | PRN
Start: 2022-10-19 — End: 2022-10-19

## 2022-10-19 MED ORDER — METHYLPREDNISOLONE SODIUM SUCC 125 MG IJ SOLR
125.0000 mg | Freq: Once | INTRAMUSCULAR | Status: DC | PRN
Start: 2022-10-19 — End: 2022-10-19

## 2022-10-19 MED ORDER — ALTEPLASE 2 MG IJ SOLR
2.0000 mg | Freq: Once | INTRAMUSCULAR | Status: DC | PRN
Start: 2022-10-19 — End: 2022-10-19

## 2022-10-19 MED ORDER — SODIUM CHLORIDE 0.9% FLUSH
3.0000 mL | Freq: Once | INTRAVENOUS | Status: DC | PRN
Start: 2022-10-19 — End: 2022-10-19

## 2022-10-19 MED ORDER — EPINEPHRINE 0.3 MG/0.3ML IJ SOAJ
0.3000 mg | Freq: Once | INTRAMUSCULAR | Status: DC | PRN
Start: 2022-10-19 — End: 2022-10-19

## 2022-10-19 MED ORDER — HEPARIN SOD (PORK) LOCK FLUSH 100 UNIT/ML IV SOLN
500.0000 [IU] | Freq: Once | INTRAVENOUS | Status: DC | PRN
Start: 2022-10-19 — End: 2022-10-19

## 2022-10-19 MED ORDER — DIPHENHYDRAMINE HCL 50 MG/ML IJ SOLN
50.0000 mg | Freq: Once | INTRAMUSCULAR | Status: DC | PRN
Start: 2022-10-19 — End: 2022-10-19

## 2022-10-19 MED ORDER — SODIUM CHLORIDE 0.9 % IV SOLN
INTRAVENOUS | Status: DC
Start: 2022-10-19 — End: 2022-10-19

## 2022-10-19 MED ORDER — SODIUM CHLORIDE 0.9 % IV SOLN
20.0000 mg | Freq: Once | INTRAVENOUS | Status: DC | PRN
Start: 2022-10-19 — End: 2022-10-19

## 2022-10-19 MED ORDER — ALBUTEROL SULFATE HFA 108 (90 BASE) MCG/ACT IN AERS
2.0000 | INHALATION_SPRAY | Freq: Once | RESPIRATORY_TRACT | Status: DC | PRN
Start: 2022-10-19 — End: 2022-10-19

## 2022-10-19 MED ORDER — SODIUM CHLORIDE 0.9 % IV SOLN
Freq: Once | INTRAVENOUS | Status: DC | PRN
Start: 2022-10-19 — End: 2022-10-19

## 2022-10-19 MED ORDER — ACETAMINOPHEN 325 MG PO TABS
650.0000 mg | ORAL_TABLET | Freq: Once | ORAL | Status: DC
Start: 2022-10-19 — End: 2022-10-19

## 2022-10-19 NOTE — Progress Notes (Signed)
Name: Michele [name]  MRN/ DOB: [id number], 1950-05-25    Age/ Sex: 72 y.o., female    PCP: Marcine Matar, MD   Reason for Endocrinology Evaluation: Osteoporosis     Date of Initial Endocrinology Evaluation: 04/15/2023    HPI: Ms. Michele [name] is a 72 y.o. female with a past medical history of HTN, DM and Dyslipidemia. The patient presented for initial endocrinology clinic visit on 10/19/2022 for consultative assistance with her Osteoporosis.   Pt was diagnosed with osteoporosis:11/07/2020 with a T-score of -3.6 at the [facility] in June 2022  Menarche at age :  Menopausal at age : at 46 Fracture Hx: ankle fracture  in her 24's. S/P slip injury  Hx of HRT: no  FH of osteoporosis or hip fracture: no Prior Hx of anti-resorptive therapy :  She was prescribed Alendronate but she did not take it due to skeptics about side effects    In reviewing her labs she was noted with slightly elevated serum calcium at 10.4 mg/DL on 08/17/8117 with an albumin of 4.5 with a corrected serum calcium of 10.0 mg/DL   I have recommended Prolia on her initial visit to our clinic, but her insurance declined it, and she was prescribed zoledronic acid which she received 06/05/2022   SUBJECTIVE:    Today (10/19/22):  Michele [name] is here for follow-up on osteoporosis.  She is accompanied by her spouse today   She received her initial zoledronic acid injection on 06/05/2022, denies any side effects   She has not been following weightbearing exercises Has occasional heartburn  Denies constipation or diarrhea  Denies falls  Denies bone fractures    Calcium 600 mg BID  Vitamin D 1000 iu daily      HISTORY:  Past Medical History:  Past Medical History:  Diagnosis Date   Diabetes mellitus without complication (HCC)    Hypertension    Past Surgical History:  Past Surgical History:  Procedure Laterality Date   CATARACT EXTRACTION, BILATERAL     08/21/2020 right, 09/11/2020 left     Social History:  reports that she has never smoked. She has never used smokeless tobacco. She reports that she does not drink alcohol and does not use drugs. Family History: family history includes Diabetes in her brother, father, and mother; Hypertension in her brother, father, and mother.   HOME MEDICATIONS: Allergies as of 10/19/2022       Reactions   Farxiga [dapagliflozin] Other (See Comments)   Dizzy        Medication List        Accurate as of October 19, 2022  2:05 PM. If you have any questions, ask your nurse or doctor.          amLODipine 5 MG tablet Commonly known as: NORVASC Take 1 tablet (5 mg total) by mouth daily.   calcium carbonate 600 MG Tabs tablet Commonly known as: Calcium 600 Take 1 tablet (600 mg total) by mouth 2 (two) times daily with a meal.   glipiZIDE 10 MG tablet Commonly known as: GLUCOTROL TAKE 1 TABLET TWICE DAILY   lisinopril-hydrochlorothiazide 20-25 MG tablet Commonly known as: ZESTORETIC Take 1 tablet by mouth daily.   metFORMIN 1000 MG tablet Commonly known as: GLUCOPHAGE Take 1 tablet (1,000 mg total) by mouth 2 (two) times daily with a meal.   omeprazole 20 MG capsule Commonly known as: PRILOSEC TAKE 1 CAPSULE (20 MG TOTAL) BY MOUTH DAILY AS NEEDED (FOR ACID REFLUX).  OneTouch Verio test strip Generic drug: glucose blood Must keep upcoming office visit for refills   Premarin vaginal cream Generic drug: conjugated estrogens Place 1 Applicatorful vaginally at bedtime. For 2 weeks then use three times a week   simvastatin 20 MG tablet Commonly known as: ZOCOR TAKE 1 TABLET EVERY DAY   sitaGLIPtin 50 MG tablet Commonly known as: Januvia Take 1 tablet (50 mg total) by mouth daily.   True Metrix Meter w/Device Kit Must keep upcoming office visit for refills   TRUEplus Lancets 28G Misc Use to check blood sugar twice daily.   Vitamin D (Cholecalciferol) 10 MCG (400 UNIT) Tabs Take 10 mcg by mouth daily.           REVIEW OF SYSTEMS: A comprehensive ROS was conducted with the patient and is negative except as per HPI    OBJECTIVE:  VS: BP 110/66 (BP Location: Left Arm, Patient Position: Sitting, Cuff Size: Small)   Pulse 86   Ht 5' 1.5" (1.562 m)   Wt 121 lb (54.9 kg)   SpO2 99%   BMI 22.49 kg/m    Wt Readings from Last 3 Encounters:  10/19/22 121 lb (54.9 kg)  09/07/22 119 lb 6.4 oz (54.2 kg)  07/07/22 121 lb (54.9 kg)     EXAM: General: Pt appears well and is in NAD Seems disengaged, was cutting her nails with a nail clipper during office visit  Lungs: Clear with good BS bilat  Heart: Auscultation: RRR.  Abdomen:  soft, nontender  Extremities:  BL LE: No pretibial edema   Mental Status: Judgment, insight: Intact Orientation: Oriented to time, place, and person Mood and affect: No depression, anxiety, or agitation     DATA REVIEWED:   Latest Reference Range & Units 04/14/22 10:50  Sodium 135 - 145 mEq/L 136  Potassium 3.5 - 5.1 mEq/L 4.1  Chloride 96 - 112 mEq/L 96  CO2 19 - 32 mEq/L 27  Glucose 70 - 99 mg/dL 161 (H)  BUN 6 - 23 mg/dL 9  Creatinine 0.96 - 0.45 mg/dL 4.09  Calcium 8.4 - 81.1 mg/dL 91.4  Albumin 3.5 - 5.2 g/dL 4.3  GFR >78.29 mL/min 88.66    Latest Reference Range & Units 04/14/22 10:50  VITD 30.00 - 100.00 ng/mL 33.83    Latest Reference Range & Units 04/14/22 10:50  TSH 0.35 - 5.50 uIU/mL 1.42  T4,Free(Direct) 0.60 - 1.60 ng/dL 5.62    DXA 06/10/8655  FINDINGS: [facility] LUMBAR SPINE L2-L4   Bone Mineral Density (BMD):  0.686 g/cm2   Young Adult T-Score:  -3.6      LEFT FEMUR total   Bone Mineral Density (BMD):  0.718 g/cm2   Young Adult T-Score: -1.8     RIGHT FOREARM (1/3 RADIUS)   Bone Mineral Density (BMD):  0.497   Young Adult T Score:  -3.3   Z Score:  -1.3    ASSESSMENT/PLAN/RECOMMENDATIONS:   Osteoporosis :  - Emphasized the importance of optimizing calcium and vitamin D intake  - Discussed weight bearing exercise  again during this visit - Her PCP prescribed alendronate but she did not take it after reading side effects, she already has heartburn .  We attempted to prescribe Prolia, but this was not covered by her insurance and she received her first zoledronic acid injection 05/2022 -I did explain to the patient that she will need annual zoledronic acid infusions for the next 2 years -I have recommended postponing bone density, as she has not had enough  time with the zoledronic acid    Medications : Calcium 600 mg BID  Vitamin D 1000 iu daily  Continue zoledronic acid 5 mg IV yearly   F/U 1 year  Signed electronically by: Lyndle Herrlich, MD  [facility]  [facility] [address] [place] [place], [place] [address] Phone: [phone] FAX: [phone]   CC: Marcine Matar, MD [address] [place] [address] Phone: [phone] Fax: [phone]   Return to Endocrinology clinic as below: Future Appointments  Date Time Provider Department Center  11/06/2022  1:50 PM Marcine Matar, MD [id number] None  03/09/2023 10:20 AM Marguerita Beards, MD [facility]

## 2022-10-19 NOTE — Patient Instructions (Addendum)
Calcium 600 mg twice daily  Vitamin D 1000 iu daily   [facility] center in [place], [place] Address: [address] [place], [place] [address]   Phone: [phone]

## 2022-10-21 ENCOUNTER — Other Ambulatory Visit: Payer: Self-pay | Admitting: Internal Medicine

## 2022-10-21 DIAGNOSIS — E1165 Type 2 diabetes mellitus with hyperglycemia: Secondary | ICD-10-CM

## 2022-11-06 ENCOUNTER — Encounter: Payer: Self-pay | Admitting: Internal Medicine

## 2022-11-06 ENCOUNTER — Ambulatory Visit: Payer: Medicare HMO | Attending: Internal Medicine | Admitting: Internal Medicine

## 2022-11-06 DIAGNOSIS — E1169 Type 2 diabetes mellitus with other specified complication: Secondary | ICD-10-CM | POA: Diagnosis not present

## 2022-11-06 DIAGNOSIS — E785 Hyperlipidemia, unspecified: Secondary | ICD-10-CM | POA: Diagnosis not present

## 2022-11-06 DIAGNOSIS — E1159 Type 2 diabetes mellitus with other circulatory complications: Secondary | ICD-10-CM | POA: Diagnosis not present

## 2022-11-06 DIAGNOSIS — E1165 Type 2 diabetes mellitus with hyperglycemia: Secondary | ICD-10-CM | POA: Diagnosis not present

## 2022-11-06 DIAGNOSIS — I152 Hypertension secondary to endocrine disorders: Secondary | ICD-10-CM | POA: Diagnosis not present

## 2022-11-06 DIAGNOSIS — Z7984 Long term (current) use of oral hypoglycemic drugs: Secondary | ICD-10-CM

## 2022-11-06 MED ORDER — GLIPIZIDE 10 MG PO TABS: ORAL_TABLET | ORAL | 1 refills | Status: DC

## 2022-11-06 MED ORDER — METFORMIN HCL 1000 MG PO TABS: 1000.0000 mg | ORAL_TABLET | Freq: Two times a day (BID) | ORAL | 1 refills | Status: DC

## 2022-11-06 MED ORDER — SIMVASTATIN 20 MG PO TABS: ORAL_TABLET | ORAL | 1 refills | Status: DC

## 2022-11-06 NOTE — Progress Notes (Signed)
Patient ID: Michele [name], female   DOB: July 03, 1950, 72 y.o.   MRN: [id number] Virtual Visit via Video Note  I connected with Michele [name] on 11/06/2022 at 2:01 PM by a video enabled telemedicine application and verified that I am speaking with the correct person using two identifiers.  Location: Patient: home Provider: Office   I discussed the limitations of evaluation and management by telemedicine and the availability of in person appointments. The patient expressed understanding and agreed to proceed.  History of Present Illness: Patient with history of DM 2, HTN, HL, vaginal prolapse, osteoporosis on Reclast  Osteoporosis:  followed by Dr. Brooks Sailors.  On Reclast.  Reports taking Vit D and Ca OTC  DM: Lab Results  Component Value Date   HGBA1C 9.2 (A) 07/07/2022  Checks BS 3x/wk at bedtime.  Gives range around 200 Reports compliance Metformin 1 gram BID, Glucotrol BID 10 mg and Januvia 50 mg Eating less sugar. Walking 3x/wk for 1/2 hr   HTN: Reports compliance with lisinopril/HCTZ 20/25 mg and amlodipine 5 mg daily.  Checks BP once a wk.  Reports SBP less than 130; DBP in 70s.  She limits salt in the foods.  No chest pains or shortness of breath.  No LE edema  HL: Compliant with taking simvastatin. Last LDL was 48. Outpatient Encounter Medications as of 11/06/2022  Medication Sig   amLODipine (NORVASC) 5 MG tablet Take 1 tablet (5 mg total) by mouth daily.   Blood Glucose Monitoring Suppl (TRUE METRIX METER) w/Device KIT Must keep upcoming office visit for refills   calcium carbonate (CALCIUM 600) 600 MG TABS tablet Take 1 tablet (600 mg total) by mouth 2 (two) times daily with a meal.   glipiZIDE (GLUCOTROL) 10 MG tablet TAKE 1 TABLET TWICE DAILY   glucose blood (ONETOUCH VERIO) test strip Must keep upcoming office visit for refills   lisinopril-hydrochlorothiazide (ZESTORETIC) 20-25 MG tablet Take 1 tablet by mouth daily.   metFORMIN (GLUCOPHAGE) 1000 MG tablet TAKE 1  TABLET TWICE DAILY WITH MEALS   omeprazole (PRILOSEC) 20 MG capsule TAKE 1 CAPSULE (20 MG TOTAL) BY MOUTH DAILY AS NEEDED (FOR ACID REFLUX).   simvastatin (ZOCOR) 20 MG tablet TAKE 1 TABLET EVERY DAY   sitaGLIPtin (JANUVIA) 50 MG tablet Take 1 tablet (50 mg total) by mouth daily.   TRUEplus Lancets 28G MISC Use to check blood sugar twice daily.   Vitamin D, Cholecalciferol, 10 MCG (400 UNIT) TABS Take 10 mcg by mouth daily.   conjugated estrogens (PREMARIN) vaginal cream Place 1 Applicatorful vaginally at bedtime. For 2 weeks then use three times a week (Patient not taking: Reported on 11/06/2022)   No facility-administered encounter medications on file as of 11/06/2022.      Observations/Objective: Pleasant elderly female sitting in chair in NAD  Lab Results  Component Value Date   WBC 7.7 11/04/2021   HGB 12.6 11/04/2021   HCT 38.5 11/04/2021   MCV 83 11/04/2021   PLT 321 11/04/2021     Chemistry      Component Value Date/Time   NA 136 04/14/2022 1050   NA 137 11/04/2021 1227   K 4.1 04/14/2022 1050   CL 96 04/14/2022 1050   CO2 27 04/14/2022 1050   BUN 9 04/14/2022 1050   BUN 8 11/04/2021 1227   CREATININE 0.67 04/14/2022 1050      Component Value Date/Time   CALCIUM 10.1 04/14/2022 1050   ALKPHOS 62 11/04/2021 1227   AST 20 11/04/2021 1227  ALT 18 11/04/2021 1227   BILITOT 0.4 11/04/2021 1227       Assessment and Plan: 1. Type 2 diabetes mellitus with hyperglycemia, without long-term current use of insulin (HCC) Advised that goal for blood sugars 2 hours after meal is less than 180.  Bedtime blood sugars are above goal.  She will continue metformin 1 g twice a day, Glucotrol 10 mg twice a day and Januvia 50 mg daily.  We will check A1c.  If still above goal she states she would be agreeable to increasing the dose of the Januvia.  Declines insulin.  Urged her to continue healthy eating habits and regular exercise - Hemoglobin A1c; Future - Microalbumin / creatinine  urine ratio; Future - Ambulatory referral to Ophthalmology - glipiZIDE (GLUCOTROL) 10 MG tablet; TAKE 1 TABLET TWICE DAILY  Dispense: 180 tablet; Refill: 1 - metFORMIN (GLUCOPHAGE) 1000 MG tablet; Take 1 tablet (1,000 mg total) by mouth 2 (two) times daily with a meal.  Dispense: 180 tablet; Refill: 1 - CBC; Future - Comprehensive metabolic panel; Future  2. Hyperlipidemia associated with type 2 diabetes mellitus (HCC) Continue simvastatin - simvastatin (ZOCOR) 20 MG tablet; TAKE 1 TABLET EVERY DAY  Dispense: 90 tablet; Refill: 1 - Lipid panel; Future  3. Hypertension associated with diabetes (HCC) Reports good blood pressure readings at home.  Continue lisinopril/HCTZ and Norvasc.   Follow Up Instructions: 4 mths   I discussed the assessment and treatment plan with the patient. The patient was provided an opportunity to ask questions and all were answered. The patient agreed with the plan and demonstrated an understanding of the instructions.   The patient was advised to call back or seek an in-person evaluation if the symptoms worsen or if the condition fails to improve as anticipated.  I spent 16 minutes dedicated to the care of this patient on the date of this encounter to include previsit review of chart, face-to-face time with the patient discussing diagnosis and management and postvisit entering of orders.  This note has been created with Education officer, environmental. Any transcriptional errors are unintentional.  Jonah Blue, MD

## 2022-11-11 ENCOUNTER — Ambulatory Visit: Payer: Medicare HMO | Attending: Internal Medicine

## 2022-11-11 DIAGNOSIS — E1169 Type 2 diabetes mellitus with other specified complication: Secondary | ICD-10-CM | POA: Diagnosis not present

## 2022-11-11 DIAGNOSIS — E1165 Type 2 diabetes mellitus with hyperglycemia: Secondary | ICD-10-CM | POA: Diagnosis not present

## 2022-11-11 DIAGNOSIS — E785 Hyperlipidemia, unspecified: Secondary | ICD-10-CM | POA: Diagnosis not present

## 2022-11-12 ENCOUNTER — Telehealth: Payer: Self-pay | Admitting: Internal Medicine

## 2022-11-12 LAB — CBC
Hemoglobin: 11.6 g/dL (ref 11.1–15.9)
Platelets: 316 10*3/uL (ref 150–450)
RBC: 4.24 x10E6/uL (ref 3.77–5.28)
WBC: 7.1 10*3/uL (ref 3.4–10.8)

## 2022-11-12 LAB — COMPREHENSIVE METABOLIC PANEL
ALT: 16 IU/L (ref 0–32)
AST: 19 IU/L (ref 0–40)
BUN: 12 mg/dL (ref 8–27)
Creatinine, Ser: 0.79 mg/dL (ref 0.57–1.00)
Total Protein: 7.2 g/dL (ref 6.0–8.5)

## 2022-11-12 LAB — LIPID PANEL
Chol/HDL Ratio: 2.5 ratio (ref 0.0–4.4)
Cholesterol, Total: 154 mg/dL (ref 100–199)
LDL Chol Calc (NIH): 48 mg/dL (ref 0–99)
Triglycerides: 291 mg/dL — ABNORMAL HIGH (ref 0–149)

## 2022-11-12 LAB — MICROALBUMIN / CREATININE URINE RATIO

## 2022-11-12 LAB — HEMOGLOBIN A1C: Hgb A1c MFr Bld: 10.4 % — ABNORMAL HIGH (ref 4.8–5.6)

## 2022-11-12 NOTE — Telephone Encounter (Signed)
PC placed to pt today to go over lab results. I got an automated VM.  I did not leave a message.

## 2022-11-13 LAB — CBC
Hematocrit: 35.8 % (ref 34.0–46.6)
MCH: 27.4 pg (ref 26.6–33.0)
MCHC: 32.4 g/dL (ref 31.5–35.7)
MCV: 84 fL (ref 79–97)
RDW: 13.8 % (ref 11.7–15.4)

## 2022-11-13 LAB — HEMOGLOBIN A1C: Est. average glucose Bld gHb Est-mCnc: 252 mg/dL

## 2022-11-13 LAB — LIPID PANEL
HDL: 62 mg/dL (ref >39)
VLDL Cholesterol Cal: 44 mg/dL — ABNORMAL HIGH (ref 5–40)

## 2022-11-13 LAB — COMPREHENSIVE METABOLIC PANEL
Albumin: 4.3 g/dL (ref 3.9–4.9)
Alkaline Phosphatase: 60 IU/L (ref 44–121)
BUN/Creatinine Ratio: 15 (ref 12–28)
Bilirubin Total: 0.3 mg/dL (ref 0.0–1.2)
CO2: 23 mmol/L (ref 20–29)
Calcium: 10.2 mg/dL (ref 8.7–10.3)
Chloride: 98 mmol/L (ref 96–106)
Globulin, Total: 2.9 g/dL (ref 1.5–4.5)
Glucose: 197 mg/dL — ABNORMAL HIGH (ref 70–99)
Potassium: 4.1 mmol/L (ref 3.5–5.2)
Sodium: 140 mmol/L (ref 134–144)
eGFR: 80 mL/min/{1.73_m2} (ref >59)

## 2022-11-13 LAB — MICROALBUMIN / CREATININE URINE RATIO
Creatinine, Urine: 97 mg/dL
Microalbumin, Urine: 5.1 ug/mL

## 2022-11-25 ENCOUNTER — Other Ambulatory Visit: Payer: Self-pay | Admitting: Internal Medicine

## 2022-11-25 DIAGNOSIS — I152 Hypertension secondary to endocrine disorders: Secondary | ICD-10-CM

## 2022-12-07 DIAGNOSIS — S66312A Strain of extensor muscle, fascia and tendon of right middle finger at wrist and hand level, initial encounter: Secondary | ICD-10-CM | POA: Diagnosis not present

## 2022-12-08 ENCOUNTER — Other Ambulatory Visit: Payer: Self-pay | Admitting: Internal Medicine

## 2022-12-08 DIAGNOSIS — E1165 Type 2 diabetes mellitus with hyperglycemia: Secondary | ICD-10-CM

## 2022-12-11 DIAGNOSIS — M79644 Pain in right finger(s): Secondary | ICD-10-CM | POA: Diagnosis not present

## 2022-12-23 DIAGNOSIS — M79641 Pain in right hand: Secondary | ICD-10-CM | POA: Diagnosis not present

## 2023-01-14 DIAGNOSIS — M79644 Pain in right finger(s): Secondary | ICD-10-CM | POA: Diagnosis not present

## 2023-01-20 ENCOUNTER — Other Ambulatory Visit: Payer: Self-pay | Admitting: Internal Medicine

## 2023-01-20 DIAGNOSIS — I152 Hypertension secondary to endocrine disorders: Secondary | ICD-10-CM

## 2023-02-12 ENCOUNTER — Telehealth: Payer: Self-pay

## 2023-02-12 DIAGNOSIS — M810 Age-related osteoporosis without current pathological fracture: Secondary | ICD-10-CM

## 2023-02-12 NOTE — Telephone Encounter (Signed)
Patients needs referral place to [facility] in order for insurance to cover visit.

## 2023-02-20 ENCOUNTER — Other Ambulatory Visit: Payer: Self-pay | Admitting: Internal Medicine

## 2023-02-20 DIAGNOSIS — E1165 Type 2 diabetes mellitus with hyperglycemia: Secondary | ICD-10-CM

## 2023-03-08 ENCOUNTER — Encounter: Payer: Self-pay | Admitting: Internal Medicine

## 2023-03-08 ENCOUNTER — Ambulatory Visit: Payer: Medicare HMO | Admitting: Internal Medicine

## 2023-03-08 ENCOUNTER — Ambulatory Visit: Payer: Medicare HMO | Attending: Internal Medicine | Admitting: Internal Medicine

## 2023-03-08 VITALS — BP 114/69 | HR 81 | Temp 98.1°F | Ht 61.0 in | Wt 122.0 lb

## 2023-03-08 DIAGNOSIS — Z2821 Immunization not carried out because of patient refusal: Secondary | ICD-10-CM | POA: Diagnosis not present

## 2023-03-08 DIAGNOSIS — I152 Hypertension secondary to endocrine disorders: Secondary | ICD-10-CM | POA: Diagnosis not present

## 2023-03-08 DIAGNOSIS — E1169 Type 2 diabetes mellitus with other specified complication: Secondary | ICD-10-CM | POA: Diagnosis not present

## 2023-03-08 DIAGNOSIS — E119 Type 2 diabetes mellitus without complications: Secondary | ICD-10-CM

## 2023-03-08 DIAGNOSIS — Z7984 Long term (current) use of oral hypoglycemic drugs: Secondary | ICD-10-CM

## 2023-03-08 DIAGNOSIS — E1159 Type 2 diabetes mellitus with other circulatory complications: Secondary | ICD-10-CM

## 2023-03-08 DIAGNOSIS — E785 Hyperlipidemia, unspecified: Secondary | ICD-10-CM

## 2023-03-08 DIAGNOSIS — R1013 Epigastric pain: Secondary | ICD-10-CM

## 2023-03-08 DIAGNOSIS — E1165 Type 2 diabetes mellitus with hyperglycemia: Secondary | ICD-10-CM | POA: Diagnosis not present

## 2023-03-08 LAB — POCT GLYCOSYLATED HEMOGLOBIN (HGB A1C): HbA1c, POC (controlled diabetic range): 9.8 % — AB (ref 0.0–7.0)

## 2023-03-08 LAB — GLUCOSE, POCT (MANUAL RESULT ENTRY): POC Glucose: 243 mg/dL — AB (ref 70–99)

## 2023-03-08 NOTE — Progress Notes (Signed)
Patient ID: MIREYAH ALBERTINI, female    DOB: [birth year]  MRN: [id number]  CC: Diabetes (DM f/u. /Intermittent abdominal pain X1 mo - causing heartburn/No to flu vax.)   Subjective: Michele [name] is a 72 y.o. female who presents for chronic ds management. Her concerns today include:  Patient with history of DM 2, HTN, HL, vaginal prolapse, osteoporosis on Reclast   C/o abdominal pain  intermittently x 1 mth "It hurts."  Not burning Gets acid reflux if she sleeps on RT side.  Associated with burping.  Sometimes she has GERD with meals; can occur with any foods.  Does not take anything for heartburn except warm milk.  Has Omeprazole at home but was not taking it. Thought Januvia was causing symptoms; so stopped for 2-3 days; then restarted No N/V/D.  No problems swallowing.  No wgh changes.    DM: Results for orders placed or performed in visit on 03/08/23  POCT glycosylated hemoglobin (Hb A1C)  Result Value Ref Range   Hemoglobin A1C     HbA1c POC (<> result, manual entry)     HbA1c, POC (prediabetic range)     HbA1c, POC (controlled diabetic range) 9.8 (A) 0.0 - 7.0 %  POCT glucose (manual entry)  Result Value Ref Range   POC Glucose 243 (A) 70 - 99 mg/dl  Reports compliance Metformin 1 gram BID, Glucotrol BID 10 mg and Januvia 50 mg  Not checking BS consistently Doing exercise therapy once a wk at a facility, then walks 3 days at wk at home Feels she is eating okay.  HTN: Reports compliance with lisinopril/HCTZ 20/25 mg and amlodipine 5 mg daily.  Limits salt No CP/SOB  HL:  taking Zocor consistently  HM: declines flu shot Patient Active Problem List   Diagnosis Date Noted   Age-related osteoporosis without current pathological fracture 07/01/2021   Hyperlipidemia associated with type 2 diabetes mellitus (HCC) 07/02/2019   Severe acute respiratory syndrome coronavirus 2 (SARS-CoV-2) vaccination declined 06/30/2019   Complete uterine prolapse 10/27/2016   Controlled  type 2 diabetes mellitus without complication, without long-term current use of insulin (HCC) 09/28/2016   Hypertension associated with diabetes (HCC) 09/28/2016   Hyperlipidemia 09/28/2016   Vaginal prolapse 09/28/2016     Current Outpatient Medications on File Prior to Visit  Medication Sig Dispense Refill   amLODipine (NORVASC) 5 MG tablet TAKE 1 TABLET EVERY DAY 90 tablet 1   Blood Glucose Monitoring Suppl (TRUE METRIX METER) w/Device KIT Must keep upcoming office visit for refills 1 kit 0   calcium carbonate (CALCIUM 600) 600 MG TABS tablet Take 1 tablet (600 mg total) by mouth 2 (two) times daily with a meal. 60 tablet 3   glipiZIDE (GLUCOTROL) 10 MG tablet TAKE 1 TABLET TWICE DAILY 180 tablet 1   glucose blood (ONETOUCH VERIO) test strip Must keep upcoming office visit for refills 100 strip 0   lisinopril-hydrochlorothiazide (ZESTORETIC) 20-25 MG tablet TAKE 1 TABLET EVERY DAY 90 tablet 3   metFORMIN (GLUCOPHAGE) 1000 MG tablet Take 1 tablet (1,000 mg total) by mouth 2 (two) times daily with a meal. 180 tablet 1   simvastatin (ZOCOR) 20 MG tablet TAKE 1 TABLET EVERY DAY 90 tablet 1   sitaGLIPtin (JANUVIA) 50 MG tablet TAKE 1 TABLET EVERY DAY 90 tablet 0   Vitamin D, Cholecalciferol, 10 MCG (400 UNIT) TABS Take 10 mcg by mouth daily. 100 tablet 2   conjugated estrogens (PREMARIN) vaginal cream Place 1 Applicatorful vaginally at bedtime. For  2 weeks then use three times a week (Patient not taking: Reported on 11/06/2022) 30 g 12   omeprazole (PRILOSEC) 20 MG capsule TAKE 1 CAPSULE (20 MG TOTAL) BY MOUTH DAILY AS NEEDED (FOR ACID REFLUX). (Patient not taking: Reported on 03/08/2023) 90 capsule 1   TRUEplus Lancets 28G MISC Use to check blood sugar twice daily. (Patient not taking: Reported on 03/08/2023) 100 each 2   No current facility-administered medications on file prior to visit.    Allergies  Allergen Reactions   Farxiga [Dapagliflozin] Other (See Comments)    Dizzy    Social  History   Socioeconomic History   Marital status: Married    Spouse name: Not on file   Number of children: 2   Years of education: Not on file   Highest education level: Never attended school  Occupational History   Occupation: LPN  Tobacco Use   Smoking status: Never   Smokeless tobacco: Never  Vaping Use   Vaping status: Never Used  Substance and Sexual Activity   Alcohol use: No   Drug use: No   Sexual activity: Not Currently  Other Topics Concern   Not on file  Social History Narrative   Not on file   Social Determinants of Health   Financial Resource Strain: Low Risk  (03/08/2023)   Overall Financial Resource Strain (CARDIA)    Difficulty of Paying Living Expenses: Not hard at all  Food Insecurity: No Food Insecurity (03/08/2023)   Hunger Vital Sign    Worried About Running Out of Food in the Last Year: Never true    Ran Out of Food in the Last Year: Never true  Transportation Needs: No Transportation Needs (03/07/2023)   PRAPARE - Administrator, Civil Service (Medical): No    Lack of Transportation (Non-Medical): No  Physical Activity: Inactive (03/08/2023)   Exercise Vital Sign    Days of Exercise per Week: 0 days    Minutes of Exercise per Session: 0 min  Stress: No Stress Concern Present (03/08/2023)   [employer] of Occupational Health - Occupational Stress Questionnaire    Feeling of Stress : Not at all  Social Connections: Socially Integrated (03/08/2023)   Social Connection and Isolation Panel [NHANES]    Frequency of Communication with Friends and Family: More than three times a week    Frequency of Social Gatherings with Friends and Family: More than three times a week    Attends Religious Services: More than 4 times per year    Active Member of [employer] or Organizations: Yes    Attends Banker Meetings: 1 to 4 times per year    Marital Status: Married  Catering manager Violence: Not At Risk (03/08/2023)   Humiliation,  Afraid, Rape, and Kick questionnaire    Fear of Current or Ex-Partner: No    Emotionally Abused: No    Physically Abused: No    Sexually Abused: No    Family History  Problem Relation Age of Onset   Diabetes Mother    Hypertension Mother    Diabetes Father    Hypertension Father    Diabetes Brother    Hypertension Brother    Breast cancer Neg Hx     Past Surgical History:  Procedure Laterality Date   CATARACT EXTRACTION, BILATERAL     08/21/2020 right, 09/11/2020 left    ROS: Review of Systems Negative except as stated above  PHYSICAL EXAM: BP 114/69 (BP Location: Left Arm, Patient Position: Sitting, Cuff  Size: Normal)   Pulse 81   Temp 98.1 F (36.7 C) (Oral)   Ht 5\' 1"  (1.549 m)   Wt 122 lb (55.3 kg)   SpO2 98%   BMI 23.05 kg/m   Physical Exam  General appearance - alert, well appearing, and in no distress Mental status - normal mood, behavior, speech, dress, motor activity, and thought processes Neck - supple, no significant adenopathy Chest - clear to auscultation, no wheezes, rales or rhonchi, symmetric air entry Heart - normal rate, regular rhythm, normal S1, S2, no murmurs, rubs, clicks or gallops Abdomen - soft, nontender, nondistended, no masses or organomegaly Extremities - peripheral pulses normal, no pedal edema, no clubbing or cyanosis Diabetic Foot Exam - Simple   Simple Foot Form Diabetic Foot exam was performed with the following findings: Yes 03/08/2023  2:39 PM  Visual Inspection No deformities, no ulcerations, no other skin breakdown bilaterally: Yes Sensation Testing Intact to touch and monofilament testing bilaterally: Yes Pulse Check Posterior Tibialis and Dorsalis pulse intact bilaterally: Yes Comments         Latest Ref Rng & Units 11/11/2022   10:53 AM 04/14/2022   10:50 AM 11/04/2021   12:27 PM  CMP  Glucose 70 - 99 mg/dL 295  621  308   BUN 8 - 27 mg/dL 12  9  8    Creatinine 0.57 - 1.00 mg/dL 6.57  8.46  9.62   Sodium 134 - 144  mmol/L 140  136  137   Potassium 3.5 - 5.2 mmol/L 4.1  4.1  4.5   Chloride 96 - 106 mmol/L 98  96  94   CO2 20 - 29 mmol/L 23  27  26    Calcium 8.7 - 10.3 mg/dL 95.2  84.1  32.4   Total Protein 6.0 - 8.5 g/dL 7.2   7.1   Total Bilirubin 0.0 - 1.2 mg/dL 0.3   0.4   Alkaline Phos 44 - 121 IU/L 60   62   AST 0 - 40 IU/L 19   20   ALT 0 - 32 IU/L 16   18    Lipid Panel     Component Value Date/Time   CHOL 154 11/11/2022 1053   TRIG 291 (H) 11/11/2022 1053   HDL 62 11/11/2022 1053   CHOLHDL 2.5 11/11/2022 1053   LDLCALC 48 11/11/2022 1053    CBC    Component Value Date/Time   WBC 7.1 11/11/2022 1053   RBC 4.24 11/11/2022 1053   HGB 11.6 11/11/2022 1053   HCT 35.8 11/11/2022 1053   PLT 316 11/11/2022 1053   MCV 84 11/11/2022 1053   MCH 27.4 11/11/2022 1053   MCHC 32.4 11/11/2022 1053   RDW 13.8 11/11/2022 1053    ASSESSMENT AND PLAN: 1. Type 2 diabetes mellitus with hyperglycemia, without long-term current use of insulin (HCC) Not at goal Discussed healthy eating habits.  Continue regular exercise. Again discussed with her adding evening dose of insulin but patient declines.  She did not tolerate higher dose of Januvia in the past either.  She does not want to make any medication changes at this time.  Discussed risks of diabetic complications with prolonged uncontrolled diabetes - POCT glycosylated hemoglobin (Hb A1C) - POCT glucose (manual entry)  2. Diabetes mellitus treated with oral medication (HCC) See #1 above  3. Abdominal pain, epigastric She has some GERD symptoms. GERD precautions discussed.  Advised to avoid certain foods like spicy foods, tomato-based foods, juices and excessive caffeine.  Advised to eat his last meal at least 2 to 3 hours before laying down at nights and to sleep with his head slightly elevated.   Advised to start the omeprazole.  If symptoms persist, she will let me know and we can do imaging study at that time. - Lipase - CBC - Hepatic  Function Panel  4. Hypertension associated with diabetes (HCC) At goal.  Continue amlodipine 5 mg daily and lisinopril/HCTZ 20/25 mg daily.  5. Hyperlipidemia associated with type 2 diabetes mellitus (HCC) Continue Zocor.  6. Influenza vaccination declined   Patient was given the opportunity to ask questions.  Patient verbalized understanding of the plan and was able to repeat key elements of the plan.   This documentation was completed using Paediatric nurse.  Any transcriptional errors are unintentional.  Orders Placed This Encounter  Procedures   Lipase   CBC   Hepatic Function Panel   POCT glycosylated hemoglobin (Hb A1C)   POCT glucose (manual entry)     Requested Prescriptions    No prescriptions requested or ordered in this encounter    Return in about 4 months (around 07/09/2023).  Jonah Blue, MD, FACP

## 2023-03-09 ENCOUNTER — Ambulatory Visit: Payer: Medicare HMO | Admitting: Obstetrics and Gynecology

## 2023-03-09 ENCOUNTER — Encounter: Payer: Self-pay | Admitting: Obstetrics and Gynecology

## 2023-03-09 DIAGNOSIS — N812 Incomplete uterovaginal prolapse: Secondary | ICD-10-CM

## 2023-03-09 DIAGNOSIS — N8110 Cystocele, unspecified: Secondary | ICD-10-CM

## 2023-03-09 LAB — HEPATIC FUNCTION PANEL
ALT: 19 [IU]/L (ref 0–32)
AST: 25 [IU]/L (ref 0–40)
Albumin: 4.5 g/dL (ref 3.8–4.8)
Alkaline Phosphatase: 69 [IU]/L (ref 44–121)
Bilirubin Total: 0.3 mg/dL (ref 0.0–1.2)
Bilirubin, Direct: 0.11 mg/dL (ref 0.00–0.40)
Total Protein: 7.5 g/dL (ref 6.0–8.5)

## 2023-03-09 LAB — LIPASE: Lipase: 33 U/L (ref 14–85)

## 2023-03-09 LAB — CBC
Hematocrit: 40.1 % (ref 34.0–46.6)
Hemoglobin: 12.8 g/dL (ref 11.1–15.9)
MCH: 27.1 pg (ref 26.6–33.0)
MCHC: 31.9 g/dL (ref 31.5–35.7)
MCV: 85 fL (ref 79–97)
Platelets: 352 10*3/uL (ref 150–450)
RBC: 4.73 x10E6/uL (ref 3.77–5.28)
RDW: 14 % (ref 11.7–15.4)
WBC: 9.9 10*3/uL (ref 3.4–10.8)

## 2023-03-09 NOTE — Progress Notes (Signed)
[facility]   Subjective:     Chief Complaint:  Chief Complaint  Patient presents with   Pessary Check   History of Present Illness: JOVANA ILL is a 72 y.o. female with stage II pelvic organ prolapse who presents for a pessary check. She is using a size #2 ring with support pessary. The pessary has been working well and she has no complaints. She is not using vaginal estrogen regularly but does use it sometimes- she does not need a refill. She denies vaginal bleeding. Not interested in surgery at this time.   Past Medical History: Patient  has a past medical history of Diabetes mellitus without complication (HCC) and Hypertension.   Past Surgical History: She  has a past surgical history that includes Cataract extraction, bilateral.   Medications: She has a current medication list which includes the following prescription(s): amlodipine, true metrix meter, calcium carbonate, premarin, glipizide, onetouch verio, lisinopril-hydrochlorothiazide, metformin, omeprazole, simvastatin, januvia, trueplus lancets 28g, and vitamin d (cholecalciferol).   Allergies: Patient is allergic to farxiga [dapagliflozin].   Social History: Patient  reports that she has never smoked. She has never used smokeless tobacco. She reports that she does not drink alcohol and does not use drugs.      Objective:    Physical Exam: There were no vitals taken for this visit. Gen: No apparent distress, A&O x 3.  Detailed Urogynecologic Evaluation:  Pelvic Exam: Normal external female genitalia; Bartholin's and Skene's glands normal in appearance; urethral meatus normal in appearance, no urethral masses or discharge. Pessary was not in place. Speculum exam revealed no lesions in the vagina. A new #2 ring with support pessary was placed. It was comfortable to the patient and fit well.    Assessment/Plan:    Assessment: Ms. Dancer is a 72 y.o. with stage II pelvic organ prolapse here for  a pessary check. She is doing well.  Plan: She will remove the pessary weekly. Advised to use estrogen cream twice a week for atrophy .   She will follow-up in 6 months for a pessary check or sooner as needed.  All questions were answered.   Marguerita Beards, MD  Time spent: I spent 17 minutes dedicated to the care of this patient on the date of this encounter to include pre-visit review of records, face-to-face time with the patient and post visit documentation.

## 2023-03-24 ENCOUNTER — Other Ambulatory Visit (HOSPITAL_BASED_OUTPATIENT_CLINIC_OR_DEPARTMENT_OTHER): Payer: Medicare HMO | Admitting: Pharmacist

## 2023-03-24 DIAGNOSIS — E1165 Type 2 diabetes mellitus with hyperglycemia: Secondary | ICD-10-CM | POA: Diagnosis not present

## 2023-03-24 DIAGNOSIS — Z7984 Long term (current) use of oral hypoglycemic drugs: Secondary | ICD-10-CM | POA: Diagnosis not present

## 2023-03-24 NOTE — Progress Notes (Signed)
Pharmacy Quality Measure Review  This patient is appearing on a report for being at risk of failing the adherence measure for diabetes medications this calendar year.   Medication: metformin  Last fill date: 12/28/2022 for 90 day supply  Insurance report was not up to date. No action needed at this time.   Butch Penny, PharmD, [place]y [place], CPP Clinical Pharmacist [facility] [phone]

## 2023-04-04 ENCOUNTER — Other Ambulatory Visit: Payer: Self-pay | Admitting: Internal Medicine

## 2023-04-04 DIAGNOSIS — E1169 Type 2 diabetes mellitus with other specified complication: Secondary | ICD-10-CM

## 2023-04-21 ENCOUNTER — Other Ambulatory Visit: Payer: Self-pay | Admitting: Internal Medicine

## 2023-04-21 DIAGNOSIS — E1159 Type 2 diabetes mellitus with other circulatory complications: Secondary | ICD-10-CM

## 2023-05-03 ENCOUNTER — Other Ambulatory Visit: Payer: Self-pay | Admitting: Internal Medicine

## 2023-05-03 ENCOUNTER — Telehealth: Payer: Self-pay | Admitting: Internal Medicine

## 2023-05-03 DIAGNOSIS — E1165 Type 2 diabetes mellitus with hyperglycemia: Secondary | ICD-10-CM

## 2023-05-03 DIAGNOSIS — R1013 Epigastric pain: Secondary | ICD-10-CM

## 2023-05-03 NOTE — Telephone Encounter (Signed)
Referral submitted to the gastroenterologist.  Please inquire what the orthopedics referral is for.

## 2023-05-03 NOTE — Telephone Encounter (Signed)
Pt is calling to request referral GI & orthro care for stomach concerns.  Please advise Cb- [phone]

## 2023-05-06 NOTE — Telephone Encounter (Signed)
Called but no answer. LVM to call back.  

## 2023-05-07 NOTE — Telephone Encounter (Signed)
Called patient to inquired what the Orthopedics referral is for.   Please ask when patient calls back.

## 2023-05-10 NOTE — Telephone Encounter (Signed)
Pt called back, pt informed by endocrinologist to have referral sent to orthopedics. Pt referred by endocrinologist to orthopedic, but pt was informed by insurance that the referral needs to be sent by PCP. Pt states she has osteoporosis an needs the referral for insurance to pay for the visits.

## 2023-05-11 NOTE — Telephone Encounter (Signed)
Osteoporosis is not managed by Ortho except if she has a fracture. I will defer till PCP is back in the office to have this conversation with her.

## 2023-05-15 NOTE — Telephone Encounter (Signed)
 Please inquire from pt why the endocrinologist recommended that she sees orthopedics.

## 2023-05-17 NOTE — Telephone Encounter (Signed)
 Called but no answer. LVM to call back to further clarify need for ortho referral. Informed that osteoporosis is usually not managed by ortho unless there is a fracture. Awaiting call back.

## 2023-05-18 NOTE — Telephone Encounter (Signed)
 Called but no answer. LVM to call back.

## 2023-05-19 NOTE — Telephone Encounter (Signed)
 Third attempt. Called but no answer. LVM to call back.

## 2023-05-21 ENCOUNTER — Telehealth: Payer: Self-pay | Admitting: Internal Medicine

## 2023-05-21 NOTE — Telephone Encounter (Signed)
 Patient is calling from [facility] about a referral to ortho. Stating that her insurance requires referral. Please advise CB- [phone]

## 2023-05-24 ENCOUNTER — Encounter: Payer: Self-pay | Admitting: Physician Assistant

## 2023-05-24 NOTE — Telephone Encounter (Signed)
 Call placed to patient unable to reach message left on VM.

## 2023-06-08 ENCOUNTER — Ambulatory Visit: Payer: Medicare HMO | Admitting: Physician Assistant

## 2023-06-08 ENCOUNTER — Encounter: Payer: Self-pay | Admitting: Physician Assistant

## 2023-06-08 VITALS — BP 132/84 | HR 90 | Ht 61.0 in | Wt 121.2 lb

## 2023-06-08 DIAGNOSIS — R1013 Epigastric pain: Secondary | ICD-10-CM | POA: Diagnosis not present

## 2023-06-08 DIAGNOSIS — R1310 Dysphagia, unspecified: Secondary | ICD-10-CM | POA: Diagnosis not present

## 2023-06-08 DIAGNOSIS — K219 Gastro-esophageal reflux disease without esophagitis: Secondary | ICD-10-CM

## 2023-06-08 MED ORDER — OMEPRAZOLE 40 MG PO CPDR: 40.0000 mg | DELAYED_RELEASE_CAPSULE | Freq: Every day | ORAL | 2 refills | Status: DC

## 2023-06-08 NOTE — Patient Instructions (Signed)
We have sent the following medications to your pharmacy for you to pick up at your convenience: Omeprazole 40 mg daily 30-60 minutes before breakfast.   You have been scheduled for a Barium Esophogram at [facility] (1st floor of the hospital) on Thursday 06/17/23 at 10 am. Please arrive 30 minutes prior to your appointment for registration. Make certain not to have anything to eat or drink 3 hours prior to your test. If you need to reschedule for any reason, please contact radiology at [phone] to do so. __________________________________________________________________ A barium swallow is an examination that concentrates on views of the esophagus. This tends to be a double contrast exam (barium and two liquids which, when combined, create a gas to distend the wall of the oesophagus) or single contrast (non-ionic iodine based). The study is usually tailored to your symptoms so a good history is essential. Attention is paid during the study to the form, structure and configuration of the esophagus, looking for functional disorders (such as aspiration, dysphagia, achalasia, motility and reflux) EXAMINATION You may be asked to change into a gown, depending on the type of swallow being performed. A radiologist and radiographer will perform the procedure. The radiologist will advise you of the type of contrast selected for your procedure and direct you during the exam. You will be asked to stand, sit or lie in several different positions and to hold a small amount of fluid in your mouth before being asked to swallow while the imaging is performed .In some instances you may be asked to swallow barium coated marshmallows to assess the motility of a solid food bolus. The exam can be recorded as a digital or video fluoroscopy procedure. POST PROCEDURE It will take 1-2 days for the barium to pass through your system. To facilitate this, it is important, unless otherwise directed, to increase your  fluids for the next 24-48hrs and to resume your normal diet.  This test typically takes about 30 minutes to perform. ____________________________________________________________________

## 2023-06-08 NOTE — Progress Notes (Signed)
Chief Complaint: Epigastric pain  HPI:    Michele [name] is a 73 year old female with a past medical history as listed below including diabetes, who was referred to me by Michele Matar, MD for a complaint of epigastric pain.     03/06/2022 Cologuard negative.    11/11/2022 hemoglobin A1c 10.4.    03/08/2023 CBC, hepatic function panel and lipase normal.  Hemoglobin A1c 9.8.    Today, patient presents to clinic accompanied by her husband who does assist with history, describes about a year ago she felt what she describes as a "mass" just at the bottom of her sternum, apparently thought nothing of this really but then over the past 5 months has started to have some symptoms including reflux as well as an epigastric burning pain.  Tells me that sometimes she feels so much pain in her "esophagus" that she cannot eat.  She stops and typically within a few minutes this pain will get better and then she can keep eating.  Denies any true dysphagia symptoms, food does not get stuck.  Apparently was given Omeprazole (unclear what dosage) from PCP but does not use it very much because "I am on so much other medicine".  When she has used it, it does help with reflux.  Currently only drinking warm milk to help with symptoms.  Denies any NSAID use.    Denies fever, chills, symptoms that awaken her from sleep, weight loss, nausea or vomiting.  Past Medical History:  Diagnosis Date   Diabetes mellitus without complication (HCC)    Hypertension     Past Surgical History:  Procedure Laterality Date   CATARACT EXTRACTION, BILATERAL     08/21/2020 right, 09/11/2020 left    Current Outpatient Medications  Medication Sig Dispense Refill   amLODipine (NORVASC) 5 MG tablet TAKE 1 TABLET EVERY DAY 90 tablet 1   Blood Glucose Monitoring Suppl (TRUE METRIX METER) w/Device KIT Must keep upcoming office visit for refills 1 kit 0   calcium carbonate (CALCIUM 600) 600 MG TABS tablet Take 1 tablet (600 mg total) by  mouth 2 (two) times daily with a meal. 60 tablet 3   conjugated estrogens (PREMARIN) vaginal cream Place 1 Applicatorful vaginally at bedtime. For 2 weeks then use three times a week (Patient not taking: Reported on 11/06/2022) 30 g 12   glipiZIDE (GLUCOTROL) 10 MG tablet TAKE 1 TABLET TWICE DAILY 180 tablet 3   glucose blood (ONETOUCH VERIO) test strip Must keep upcoming office visit for refills 100 strip 0   JANUVIA 50 MG tablet TAKE 1 TABLET EVERY DAY 90 tablet 3   lisinopril-hydrochlorothiazide (ZESTORETIC) 20-25 MG tablet TAKE 1 TABLET EVERY DAY 90 tablet 3   metFORMIN (GLUCOPHAGE) 1000 MG tablet Take 1 tablet (1,000 mg total) by mouth 2 (two) times daily with a meal. 180 tablet 1   omeprazole (PRILOSEC) 20 MG capsule TAKE 1 CAPSULE (20 MG TOTAL) BY MOUTH DAILY AS NEEDED (FOR ACID REFLUX). (Patient not taking: Reported on 03/08/2023) 90 capsule 1   simvastatin (ZOCOR) 20 MG tablet TAKE 1 TABLET EVERY DAY 90 tablet 3   TRUEplus Lancets 28G MISC Use to check blood sugar twice daily. (Patient not taking: Reported on 03/08/2023) 100 each 2   Vitamin D, Cholecalciferol, 10 MCG (400 UNIT) TABS Take 10 mcg by mouth daily. 100 tablet 2   No current facility-administered medications for this visit.    Allergies as of 06/08/2023 - Review Complete 03/09/2023  Allergen Reaction Noted  Farxiga [dapagliflozin] Other (See Comments) 06/30/2019    Family History  Problem Relation Age of Onset   Diabetes Mother    Hypertension Mother    Diabetes Father    Hypertension Father    Diabetes Brother    Hypertension Brother    Breast cancer Neg Hx     Social History   Socioeconomic History   Marital status: Married    Spouse name: Not on file   Number of children: 2   Years of education: Not on file   Highest education level: Never attended school  Occupational History   Occupation: LPN  Tobacco Use   Smoking status: Never   Smokeless tobacco: Never  Vaping Use   Vaping status: Never Used   Substance and Sexual Activity   Alcohol use: No   Drug use: No   Sexual activity: Not Currently  Other Topics Concern   Not on file  Social History Narrative   Not on file   Social Drivers of Health   Financial Resource Strain: Low Risk  (03/08/2023)   Overall Financial Resource Strain (CARDIA)    Difficulty of Paying Living Expenses: Not hard at all  Food Insecurity: No Food Insecurity (03/08/2023)   Hunger Vital Sign    Worried About Running Out of Food in the Last Year: Never true    Ran Out of Food in the Last Year: Never true  Transportation Needs: No Transportation Needs (03/07/2023)   PRAPARE - Administrator, Civil Service (Medical): No    Lack of Transportation (Non-Medical): No  Physical Activity: Inactive (03/08/2023)   Exercise Vital Sign    Days of Exercise per Week: 0 days    Minutes of Exercise per Session: 0 min  Stress: No Stress Concern Present (03/08/2023)   [employer] of Occupational Health - Occupational Stress Questionnaire    Feeling of Stress : Not at all  Social Connections: Socially Integrated (03/08/2023)   Social Connection and Isolation Panel [NHANES]    Frequency of Communication with Friends and Family: More than three times a week    Frequency of Social Gatherings with Friends and Family: More than three times a week    Attends Religious Services: More than 4 times per year    Active Member of [employer] or Organizations: Yes    Attends Banker Meetings: 1 to 4 times per year    Marital Status: Married  Catering manager Violence: Not At Risk (03/08/2023)   Humiliation, Afraid, Rape, and Kick questionnaire    Fear of Current or Ex-Partner: No    Emotionally Abused: No    Physically Abused: No    Sexually Abused: No    Review of Systems:    Constitutional: No weight loss, fever or chills Skin: No rash  Cardiovascular: No chest pain   Respiratory: No SOB  Gastrointestinal: See HPI and otherwise  negative Genitourinary: No dysuria  Neurological: No headache, dizziness or syncope Musculoskeletal: No new muscle or joint pain Hematologic: No bleeding Psychiatric: No history of depression or anxiety   Physical Exam:  Vital signs: BP 132/84   Pulse 90   Ht 5\' 1"  (1.549 m)   Wt 121 lb 4 oz (55 kg)   SpO2 98%   BMI 22.91 kg/m    Constitutional:   Pleasant elderly female appears to be in NAD, Well developed, Well nourished, alert and cooperative Head:  Normocephalic and atraumatic. Eyes:   PEERL, EOMI. No icterus. Conjunctiva pink. Ears:  Normal auditory  acuity. Neck:  Supple Throat: Oral cavity and pharynx without inflammation, swelling or lesion.  Respiratory: Respirations even and unlabored. Lungs clear to auscultation bilaterally.   No wheezes, crackles, or rhonchi.  Cardiovascular: Normal S1, S2. No MRG. Regular rate and rhythm. No peripheral edema, cyanosis or pallor.  Gastrointestinal:  Soft, nondistended, nontender. No rebound or guarding. Normal bowel sounds. No appreciable masses or hepatomegaly. Rectal:  Not performed.  Msk:  Symmetrical without gross deformities. Without edema, no deformity or joint abnormality. +prominent xiphoid process Neurologic:  Alert and  oriented x4;  grossly normal neurologically.  Skin:   Dry and intact without significant lesions or rashes. Psychiatric: Demonstrates good judgement and reason without abnormal affect or behaviors.  RELEVANT LABS AND IMAGING: CBC    Component Value Date/Time   WBC 9.9 03/08/2023 1453   RBC 4.73 03/08/2023 1453   HGB 12.8 03/08/2023 1453   HCT 40.1 03/08/2023 1453   PLT 352 03/08/2023 1453   MCV 85 03/08/2023 1453   MCH 27.1 03/08/2023 1453   MCHC 31.9 03/08/2023 1453   RDW 14.0 03/08/2023 1453    CMP     Component Value Date/Time   NA 140 11/11/2022 1053   K 4.1 11/11/2022 1053   CL 98 11/11/2022 1053   CO2 23 11/11/2022 1053   GLUCOSE 197 (H) 11/11/2022 1053   GLUCOSE 267 (H) 04/14/2022 1050    BUN 12 11/11/2022 1053   CREATININE 0.79 11/11/2022 1053   CALCIUM 10.2 11/11/2022 1053   PROT 7.5 03/08/2023 1453   ALBUMIN 4.5 03/08/2023 1453   AST 25 03/08/2023 1453   ALT 19 03/08/2023 1453   ALKPHOS 69 03/08/2023 1453   BILITOT 0.3 03/08/2023 1453   GFRNONAA 92 06/09/2019 1201   GFRAA 106 06/09/2019 1201    Assessment: 1.  Epigastric pain: With below 2.  GERD: Increased over the past 5 months, also with some esophageal pain/dysphagia which she describes differently; consider GERD +/- gastritis +/- stricture 3.  Xiphoid process: This is the "mass" she has been feeling  Plan: 1.  Increase Omeprazole to 40 mg once daily, 30 minutes before breakfast.  Instructed patient to take this every day for the next 4 to 6 weeks.  Prescribed #30 with 3 refills. 2.  Reviewed antireflux diet and lifestyle modifications. 3.  Ordered an esophagram with tablet. 4.  Patient to follow in clinic with me in Assigned to Dr. Barron Alvine today.  Hyacinth Meeker, PA-C [facility] 06/08/2023, 11:45 AM  Cc: Michele Matar, MD

## 2023-06-11 ENCOUNTER — Other Ambulatory Visit: Payer: Self-pay | Admitting: Internal Medicine

## 2023-06-11 DIAGNOSIS — E1165 Type 2 diabetes mellitus with hyperglycemia: Secondary | ICD-10-CM

## 2023-06-11 NOTE — Telephone Encounter (Signed)
Requested Prescriptions  Pending Prescriptions Disp Refills   metFORMIN (GLUCOPHAGE) 1000 MG tablet [Pharmacy Med Name: metFORMIN HCl Oral Tablet 1000 MG] 180 tablet 0    Sig: TAKE 1 TABLET TWICE DAILY WITH MEALS     Endocrinology:  Diabetes - Biguanides Failed - 06/11/2023 11:00 AM      Failed - HBA1C is between 0 and 7.9 and within 180 days    HbA1c, POC (controlled diabetic range)  Date Value Ref Range Status  03/08/2023 9.8 (A) 0.0 - 7.0 % Final         Failed - B12 Level in normal range and within 720 days    No results found for: "VITAMINB12"       Failed - CBC within normal limits and completed in the last 12 months    WBC  Date Value Ref Range Status  03/08/2023 9.9 3.4 - 10.8 x10E3/uL Final   RBC  Date Value Ref Range Status  03/08/2023 4.73 3.77 - 5.28 x10E6/uL Final   Hemoglobin  Date Value Ref Range Status  03/08/2023 12.8 11.1 - 15.9 g/dL Final   Hematocrit  Date Value Ref Range Status  03/08/2023 40.1 34.0 - 46.6 % Final   MCHC  Date Value Ref Range Status  03/08/2023 31.9 31.5 - 35.7 g/dL Final   [facility]  Date Value Ref Range Status  03/08/2023 27.1 26.6 - 33.0 pg Final   MCV  Date Value Ref Range Status  03/08/2023 85 79 - 97 fL Final   No results found for: "PLTCOUNTKUC", "LABPLAT", "POCPLA" RDW  Date Value Ref Range Status  03/08/2023 14.0 11.7 - 15.4 % Final         Passed - Cr in normal range and within 360 days    Creatinine, Ser  Date Value Ref Range Status  11/11/2022 0.79 0.57 - 1.00 mg/dL Final         Passed - eGFR in normal range and within 360 days    GFR calc Af Amer  Date Value Ref Range Status  06/09/2019 106 >59 mL/min/1.73 Final   GFR calc non Af Amer  Date Value Ref Range Status  06/09/2019 92 >59 mL/min/1.73 Final   GFR  Date Value Ref Range Status  04/14/2022 88.66 >60.00 mL/min Final    Comment:    Calculated using the CKD-EPI Creatinine Equation (2021)   eGFR  Date Value Ref Range Status  11/11/2022 80 >59  mL/min/1.73 Final         Passed - Valid encounter within last 6 months    Recent Outpatient Visits           3 months ago Type 2 diabetes mellitus with hyperglycemia, without long-term current use of insulin (HCC)   [facility] [place]. [facility] Jonah Blue B, MD   7 months ago Type 2 diabetes mellitus with hyperglycemia, without long-term current use of insulin [facility])   [facility] - [facility] Of [place]. [facility] Jonah Blue B, MD   11 months ago Type 2 diabetes mellitus with hyperglycemia, without long-term current use of insulin [facility])   [facility] - [facility] Dept Of [place]. [facility] Jonah Blue B, MD   1 year ago Type 2 diabetes mellitus with hyperglycemia, without long-term current use of insulin [facility])   [facility] - A Dept Of [place]. [facility], Rudolph B,  MD   1 year ago Type 2 diabetes mellitus with hyperglycemia, without long-term current use of insulin (HCC)   [facility] - [facility] Dept Of [place]. [facility] Marcine Matar, MD       Future Appointments             In 1 month Laural Benes Binnie Rail, MD [facility] - A Dept Of [place]igha Bridegroom. [facility]

## 2023-06-17 ENCOUNTER — Other Ambulatory Visit: Payer: Self-pay | Admitting: Physician Assistant

## 2023-06-17 ENCOUNTER — Ambulatory Visit (HOSPITAL_COMMUNITY)
Admission: RE | Admit: 2023-06-17 | Discharge: 2023-06-17 | Disposition: A | Discharge status: Home / Self Care | Payer: Medicare HMO | Source: Ambulatory Visit | Attending: Physician Assistant | Admitting: Physician Assistant

## 2023-06-17 DIAGNOSIS — K219 Gastro-esophageal reflux disease without esophagitis: Secondary | ICD-10-CM | POA: Insufficient documentation

## 2023-06-17 DIAGNOSIS — R1310 Dysphagia, unspecified: Secondary | ICD-10-CM | POA: Insufficient documentation

## 2023-06-17 DIAGNOSIS — R1013 Epigastric pain: Secondary | ICD-10-CM

## 2023-07-12 ENCOUNTER — Encounter: Payer: Self-pay | Admitting: Internal Medicine

## 2023-07-12 ENCOUNTER — Ambulatory Visit: Payer: Medicare HMO | Attending: Internal Medicine | Admitting: Internal Medicine

## 2023-07-12 VITALS — BP 113/64 | Temp 97.8°F | Ht 61.0 in | Wt 122.0 lb

## 2023-07-12 DIAGNOSIS — E785 Hyperlipidemia, unspecified: Secondary | ICD-10-CM | POA: Diagnosis not present

## 2023-07-12 DIAGNOSIS — E1169 Type 2 diabetes mellitus with other specified complication: Secondary | ICD-10-CM | POA: Diagnosis not present

## 2023-07-12 DIAGNOSIS — Z2821 Immunization not carried out because of patient refusal: Secondary | ICD-10-CM | POA: Diagnosis not present

## 2023-07-12 DIAGNOSIS — E1159 Type 2 diabetes mellitus with other circulatory complications: Secondary | ICD-10-CM

## 2023-07-12 DIAGNOSIS — I10 Essential (primary) hypertension: Secondary | ICD-10-CM

## 2023-07-12 DIAGNOSIS — Z7984 Long term (current) use of oral hypoglycemic drugs: Secondary | ICD-10-CM | POA: Diagnosis not present

## 2023-07-12 DIAGNOSIS — E119 Type 2 diabetes mellitus without complications: Secondary | ICD-10-CM

## 2023-07-12 LAB — POCT GLYCOSYLATED HEMOGLOBIN (HGB A1C): HbA1c, POC (controlled diabetic range): 9.9 % — AB (ref 0.0–7.0)

## 2023-07-12 MED ORDER — METFORMIN HCL 1000 MG PO TABS
1000.0000 mg | ORAL_TABLET | Freq: Two times a day (BID) | ORAL | 1 refills | Status: AC
Start: 2023-07-12 — End: ?

## 2023-07-12 MED ORDER — EMPAGLIFLOZIN 10 MG PO TABS: 10.0000 mg | ORAL_TABLET | Freq: Every day | ORAL | 1 refills | Status: AC

## 2023-07-12 NOTE — Progress Notes (Signed)
 Patient ID: Michele [name], female    DOB: 1950-05-21  MRN: [id number]  CC: Diabetes (DM f/u. Med refill. /No questions / concerns/No to all vax)   Subjective: Michele [name] is a 73 y.o. female who presents for chronic ds management. Her concerns today include:  Patient with history of DM 2, HTN, HL, vaginal prolapse, osteoporosis on Reclast   DM: Results for orders placed or performed in visit on 07/12/23  POCT glycosylated hemoglobin (Hb A1C)   Collection Time: 07/12/23 11:55 AM  Result Value Ref Range   Hemoglobin A1C     HbA1c POC (<> result, manual entry)     HbA1c, POC (prediabetic range)     HbA1c, POC (controlled diabetic range) 9.9 (A) 0.0 - 7.0 %  Reports compliance Metformin 1 gram BID, Glucotrol BID 10 mg and Januvia 50 mg  Not checking BS regularly Feels she does okay with eating; walks 2-3 times a wk for 20 mins for exercise. HTN: Reports compliance with lisinopril/HCTZ 20/25 mg and amlodipine 5 mg daily.  Limits salt.  No CP/SOB/HA/dizziness HL: taking Zocor consistently.  Tolerating med  She saw the gastroenterologist for epigastric pain.  Esophagram was done showing moderate reflux.  Omeprazole was increased to 40 mg daily.  Reports that she is doing much better.  HM:  declines shingles and PCV vaccine.  Due for eye exam Patient Active Problem List   Diagnosis Date Noted   Age-related osteoporosis without current pathological fracture 07/01/2021   Hyperlipidemia associated with type 2 diabetes mellitus (HCC) 07/02/2019   Severe acute respiratory syndrome coronavirus 2 (SARS-CoV-2) vaccination declined 06/30/2019   Complete uterine prolapse 10/27/2016   Controlled type 2 diabetes mellitus without complication, without long-term current use of insulin (HCC) 09/28/2016   Hypertension associated with diabetes (HCC) 09/28/2016   Hyperlipidemia 09/28/2016   Vaginal prolapse 09/28/2016     Current Outpatient Medications on File Prior to Visit  Medication  Sig Dispense Refill   amLODipine (NORVASC) 5 MG tablet TAKE 1 TABLET EVERY DAY 90 tablet 1   calcium carbonate (CALCIUM 600) 600 MG TABS tablet Take 1 tablet (600 mg total) by mouth 2 (two) times daily with a meal. 60 tablet 3   glipiZIDE (GLUCOTROL) 10 MG tablet TAKE 1 TABLET TWICE DAILY 180 tablet 3   JANUVIA 50 MG tablet TAKE 1 TABLET EVERY DAY 90 tablet 3   lisinopril-hydrochlorothiazide (ZESTORETIC) 20-25 MG tablet TAKE 1 TABLET EVERY DAY 90 tablet 3   omeprazole (PRILOSEC) 40 MG capsule Take 1 capsule (40 mg total) by mouth daily. 30 capsule 2   simvastatin (ZOCOR) 20 MG tablet TAKE 1 TABLET EVERY DAY 90 tablet 3   Vitamin D, Cholecalciferol, 10 MCG (400 UNIT) TABS Take 10 mcg by mouth daily. 100 tablet 2   No current facility-administered medications on file prior to visit.    Allergies  Allergen Reactions   Farxiga [Dapagliflozin] Other (See Comments)    Dizzy    Social History   Socioeconomic History   Marital status: Married    Spouse name: Not on file   Number of children: 2   Years of education: Not on file   Highest education level: Associate degree: occupational, Scientist, product/process development, or vocational program  Occupational History   Occupation: LPN  Tobacco Use   Smoking status: Never   Smokeless tobacco: Never  Vaping Use   Vaping status: Never Used  Substance and Sexual Activity   Alcohol use: No   Drug use: No   Sexual  activity: Not Currently  Other Topics Concern   Not on file  Social History Narrative   Not on file   Social Drivers of Health   Financial Resource Strain: Low Risk  (07/12/2023)   Overall Financial Resource Strain (CARDIA)    Difficulty of Paying Living Expenses: Not hard at all  Recent Concern: Financial Resource Strain - Medium Risk (07/11/2023)   Overall Financial Resource Strain (CARDIA)    Difficulty of Paying Living Expenses: Somewhat hard  Food Insecurity: No Food Insecurity (07/12/2023)   Hunger Vital Sign    Worried About Running Out of Food  in the Last Year: Never true    Ran Out of Food in the Last Year: Never true  Transportation Needs: No Transportation Needs (07/12/2023)   PRAPARE - Administrator, Civil Service (Medical): No    Lack of Transportation (Non-Medical): No  Physical Activity: Inactive (07/12/2023)   Exercise Vital Sign    Days of Exercise per Week: 0 days    Minutes of Exercise per Session: 0 min  Stress: No Stress Concern Present (07/12/2023)   [employer] of Occupational Health - Occupational Stress Questionnaire    Feeling of Stress : Not at all  Social Connections: Socially Integrated (07/12/2023)   Social Connection and Isolation Panel [NHANES]    Frequency of Communication with Friends and Family: Twice a week    Frequency of Social Gatherings with Friends and Family: Twice a week    Attends Religious Services: More than 4 times per year    Active Member of [employer] or Organizations: Yes    Attends Engineer, structural: More than 4 times per year    Marital Status: Married  Catering manager Violence: Not At Risk (07/12/2023)   Humiliation, Afraid, Rape, and Kick questionnaire    Fear of Current or Ex-Partner: No    Emotionally Abused: No    Physically Abused: No    Sexually Abused: No    Family History  Problem Relation Age of Onset   Diabetes Mother    Hypertension Mother    Diabetes Father    Hypertension Father    Diabetes Brother    Hypertension Brother    Breast cancer Neg Hx    Colon cancer Neg Hx    Esophageal cancer Neg Hx    Colon polyps Neg Hx     Past Surgical History:  Procedure Laterality Date   CATARACT EXTRACTION, BILATERAL     08/21/2020 right, 09/11/2020 left    ROS: Review of Systems Negative except as stated above  PHYSICAL EXAM: BP 113/64 (BP Location: Left Arm, Patient Position: Sitting)   Temp 97.8 F (36.6 C) (Oral)   Ht 5\' 1"  (1.549 m)   Wt 122 lb (55.3 kg)   BMI 23.05 kg/m   Physical Exam  General appearance - alert, well appearing,  and in no distress Mental status - normal mood, behavior, speech, dress, motor activity, and thought processes Neck - supple, no significant adenopathy Chest - clear to auscultation, no wheezes, rales or rhonchi, symmetric air entry Heart - normal rate, regular rhythm, normal S1, S2, no murmurs, rubs, clicks or gallops Extremities - peripheral pulses normal, no pedal edema, no clubbing or cyanosis      Latest Ref Rng & Units 03/08/2023    2:53 PM 11/11/2022   10:53 AM 04/14/2022   10:50 AM  CMP  Glucose 70 - 99 mg/dL  409  811   BUN 8 - 27 mg/dL  12  9   Creatinine 0.57 - 1.00 mg/dL  1.61  0.96   Sodium 045 - 144 mmol/L  140  136   Potassium 3.5 - 5.2 mmol/L  4.1  4.1   Chloride 96 - 106 mmol/L  98  96   CO2 20 - 29 mmol/L  23  27   Calcium 8.7 - 10.3 mg/dL  40.9  81.1   Total Protein 6.0 - 8.5 g/dL 7.5  7.2    Total Bilirubin 0.0 - 1.2 mg/dL 0.3  0.3    Alkaline Phos 44 - 121 IU/L 69  60    AST 0 - 40 IU/L 25  19    ALT 0 - 32 IU/L 19  16     Lipid Panel     Component Value Date/Time   CHOL 154 11/11/2022 1053   TRIG 291 (H) 11/11/2022 1053   HDL 62 11/11/2022 1053   CHOLHDL 2.5 11/11/2022 1053   LDLCALC 48 11/11/2022 1053    CBC    Component Value Date/Time   WBC 9.9 03/08/2023 1453   RBC 4.73 03/08/2023 1453   HGB 12.8 03/08/2023 1453   HCT 40.1 03/08/2023 1453   PLT 352 03/08/2023 1453   MCV 85 03/08/2023 1453   MCH 27.1 03/08/2023 1453   MCHC 31.9 03/08/2023 1453   RDW 14.0 03/08/2023 1453    ASSESSMENT AND PLAN:  1. Type 2 diabetes mellitus with other specified complication, without long-term current use of insulin (HCC) (Primary) Diabetes remains uncontrolled.  Discussed risk of developing complications from diabetes from prolonged uncontrolled diabetes.  She is not wanting any injectables.  She is agreeable to trying another oral medication in the form of Jardiance.  Advised that the medication can cause urinary frequency.  Encouraged her to drink several  glasses of water daily because of this.  Advised that the medicine can also cause vaginal yeast infection.  If this occurs she should stop the medicine and let me know.  Continue healthy eating habits and regular exercise - POCT glycosylated hemoglobin (Hb A1C) - metFORMIN (GLUCOPHAGE) 1000 MG tablet; Take 1 tablet (1,000 mg total) by mouth 2 (two) times daily with a meal.  Dispense: 180 tablet; Refill: 1 - empagliflozin (JARDIANCE) 10 MG TABS tablet; Take 1 tablet (10 mg total) by mouth daily before breakfast.  Dispense: 90 tablet; Refill: 1 - Ambulatory referral to Ophthalmology  2. Diabetes mellitus treated with oral medication (HCC) See #1 above.  3. Hypertension associated with diabetes (HCC) At goal.  Continue lisinopril/HCTZ 20/12.5 and amlodipine 5 mg daily.  4. Hyperlipidemia associated with type 2 diabetes mellitus (HCC) Continue Zocor.  5. Herpes zoster vaccination declined 6. Pneumococcal vaccination declined Recommended.  Patient declined.    Patient was given the opportunity to ask questions.  Patient verbalized understanding of the plan and was able to repeat key elements of the plan.   This documentation was completed using Paediatric nurse.  Any transcriptional errors are unintentional.  Orders Placed This Encounter  Procedures   Ambulatory referral to Ophthalmology   POCT glycosylated hemoglobin (Hb A1C)     Requested Prescriptions   Signed Prescriptions Disp Refills   metFORMIN (GLUCOPHAGE) 1000 MG tablet 180 tablet 1    Sig: Take 1 tablet (1,000 mg total) by mouth 2 (two) times daily with a meal.   empagliflozin (JARDIANCE) 10 MG TABS tablet 90 tablet 1    Sig: Take 1 tablet (10 mg total) by mouth daily before breakfast.  Return in about 4 months (around 11/11/2023) for Medicare Wellness Visit in 3-4  wks with CMA/LPN. Schedule for after 07/31/23.Marland Kitchen  Jonah Blue, MD, FACP

## 2023-07-12 NOTE — Patient Instructions (Signed)
 Your diabetes is not at goal.  We have added a new medication called Jardiance 10 mg daily.  The medication will cause more frequent urination so make sure that you drink several glasses of water daily to keep yourself hydrated.  The medication can sometimes cause vaginal yeast infection.  If this occurs, please let us know.  I have submitted a referral for you to see the eye doctor for your diabetic eye exam.

## 2023-08-06 ENCOUNTER — Ambulatory Visit: Payer: Medicare HMO | Admitting: Physician Assistant

## 2023-08-13 DIAGNOSIS — E113291 Type 2 diabetes mellitus with mild nonproliferative diabetic retinopathy without macular edema, right eye: Secondary | ICD-10-CM | POA: Diagnosis not present

## 2023-08-13 DIAGNOSIS — H52223 Regular astigmatism, bilateral: Secondary | ICD-10-CM | POA: Diagnosis not present

## 2023-08-13 DIAGNOSIS — Z961 Presence of intraocular lens: Secondary | ICD-10-CM | POA: Diagnosis not present

## 2023-08-13 DIAGNOSIS — H524 Presbyopia: Secondary | ICD-10-CM | POA: Diagnosis not present

## 2023-09-05 ENCOUNTER — Other Ambulatory Visit: Payer: Self-pay | Admitting: Internal Medicine

## 2023-09-05 DIAGNOSIS — E1159 Type 2 diabetes mellitus with other circulatory complications: Secondary | ICD-10-CM

## 2023-09-23 ENCOUNTER — Other Ambulatory Visit: Payer: Self-pay | Admitting: Physician Assistant

## 2023-10-07 ENCOUNTER — Ambulatory Visit: Admitting: Physician Assistant

## 2023-12-27 ENCOUNTER — Telehealth: Payer: Self-pay

## 2023-12-27 NOTE — Telephone Encounter (Signed)
 Done

## 2024-01-20 ENCOUNTER — Other Ambulatory Visit: Payer: Self-pay | Admitting: Internal Medicine

## 2024-01-20 DIAGNOSIS — E1159 Type 2 diabetes mellitus with other circulatory complications: Secondary | ICD-10-CM

## 2024-01-20 DIAGNOSIS — E1165 Type 2 diabetes mellitus with hyperglycemia: Secondary | ICD-10-CM

## 2024-01-20 NOTE — Telephone Encounter (Unsigned)
 Copied from CRM #[id number]. Topic: Clinical - Medication Refill >> Jan 20, 2024  3:44 PM Deleta S wrote: Medication: amLODipine  (NORVASC ) 5 MG tablet, glipiZIDE  (GLUCOTROL ) 10 MG tablet  Has the patient contacted their pharmacy? Yes (Agent: If no, request that the patient contact the pharmacy for the refill. If patient does not wish to contact the pharmacy document the reason why and proceed with request.) (Agent: If yes, when and what did the pharmacy advise?)  This is the patient's preferred pharmacy:   [id number] [place] - [address] [place] [address] Phone: [phone] Fax: [phone]  Is this the correct pharmacy for this prescription? Yes If no, delete pharmacy and type the correct one.   Has the prescription been filled recently? No  Is the patient out of the medication? Yes  Has the patient been seen for an appointment in the last year OR does the patient have an upcoming appointment? Yes  Can we respond through MyChart? No  Agent: Please be advised that Rx refills may take up to 3 business days. We ask that you follow-up with your pharmacy.

## 2024-01-21 MED ORDER — AMLODIPINE BESYLATE 5 MG PO TABS: 5.0000 mg | ORAL_TABLET | Freq: Every day | ORAL | 2 refills | Status: AC

## 2024-01-21 MED ORDER — GLIPIZIDE 10 MG PO TABS: 10.0000 mg | ORAL_TABLET | Freq: Two times a day (BID) | ORAL | 3 refills | Status: AC

## 2024-02-09 ENCOUNTER — Other Ambulatory Visit: Payer: Self-pay | Admitting: Pharmacist

## 2024-02-09 NOTE — Progress Notes (Signed)
 Pharmacy Quality Measure Review  This patient is appearing on a report for being at risk of failing the adherence measure for diabetes medications this calendar year.   Medication: glipizide  Last fill date: 01/21/2024 for 90 day supply  Herlene Fleeta Morris, PharmD, [place], CPP Clinical Pharmacist [facility] [phone]

## 2024-02-16 DIAGNOSIS — E113291 Type 2 diabetes mellitus with mild nonproliferative diabetic retinopathy without macular edema, right eye: Secondary | ICD-10-CM | POA: Diagnosis not present

## 2024-02-16 DIAGNOSIS — Z961 Presence of intraocular lens: Secondary | ICD-10-CM | POA: Diagnosis not present

## 2024-04-26 ENCOUNTER — Other Ambulatory Visit: Payer: Self-pay | Admitting: Internal Medicine

## 2024-04-26 DIAGNOSIS — E1165 Type 2 diabetes mellitus with hyperglycemia: Secondary | ICD-10-CM

## 2024-04-26 NOTE — Telephone Encounter (Unsigned)
 Copied from CRM #[id number]. Topic: Clinical - Medication Refill >> Apr 26, 2024  2:52 PM Edsel HERO wrote: Medication: glipiZIDE  (GLUCOTROL ) 10 MG tablet  Has the patient contacted their pharmacy? Yes Patient is currently out of town and needs a refill.  This is the patient's preferred pharmacy:  [id number] - [place], [place] - [address]  [address] KARIE RAMAN [place] [address] Phone: [phone] Fax: [phone]  Is this the correct pharmacy for this prescription? Yes If no, delete pharmacy and type the correct one.   Has the prescription been filled recently? Yes  Is the patient out of the medication? Yes  Has the patient been seen for an appointment in the last year OR does the patient have an upcoming appointment? Yes  Can we respond through MyChart? Yes  Agent: Please be advised that Rx refills may take up to 3 business days. We ask that you follow-up with your pharmacy.

## 2024-04-29 NOTE — Telephone Encounter (Signed)
 Requested medication (s) are due for refill today: patient requesting short supply sent to out of state pharmacy.  Requested medication (s) are on the active medication list: yes  Last refill:    Future visit scheduled: no  Notes to clinic:  patient requesting refill to out of state pharmacy, routing for approval     Requested Prescriptions  Pending Prescriptions Disp Refills   glipiZIDE  (GLUCOTROL ) 10 MG tablet 180 tablet 3    Sig: Take 1 tablet (10 mg total) by mouth 2 (two) times daily.     Endocrinology:  Diabetes - Sulfonylureas Failed - 04/29/2024  6:40 AM      Failed - HBA1C is between 0 and 7.9 and within 180 days    HbA1c, POC (controlled diabetic range)  Date Value Ref Range Status  07/12/2023 9.9 (A) 0.0 - 7.0 % Final         Failed - Cr in normal range and within 360 days    Creatinine, Ser  Date Value Ref Range Status  11/11/2022 0.79 0.57 - 1.00 mg/dL Final         Failed - Valid encounter within last 6 months    Recent Outpatient Visits           9 months ago Type 2 diabetes mellitus with other specified complication, without long-term current use of insulin (HCC)   [facility] [employer] Vicci Sober B, MD   1 year ago Type 2 diabetes mellitus with hyperglycemia, without long-term current use of insulin [facility])   [facility] [place]. [facility] Vicci Sober B, MD   1 year ago Type 2 diabetes mellitus with hyperglycemia, without long-term current use of insulin [facility])   [facility] Dept Of [place]. [facility] Vicci Sober B, MD   1 year ago Type 2 diabetes mellitus with hyperglycemia, without long-term current use of insulin [facility])   [facility] Dept Of [place]. [facility] Vicci Sober B, MD   2 years ago Type 2 diabetes mellitus with hyperglycemia, without long-term current use of  insulin (HCC)   [facility] Of [place]. [facility] Vicci Sober NOVAK, MD

## 2024-05-10 ENCOUNTER — Telehealth: Payer: Self-pay | Admitting: Internal Medicine

## 2024-05-10 DIAGNOSIS — E1159 Type 2 diabetes mellitus with other circulatory complications: Secondary | ICD-10-CM

## 2024-05-10 NOTE — Telephone Encounter (Unsigned)
 Copied from CRM [id number]. Topic: Clinical - Medication Refill >> May 10, 2024 11:42 AM Tiffini S wrote: Medication: lisinopril  10 MG tablet  Has the patient contacted their pharmacy? Yes (Agent: If no, request that the patient contact the pharmacy for the refill. If patient does not wish to contact the pharmacy document the reason why and proceed with request.) (Agent: If yes, when and what did the pharmacy advise?)  This is the patient's preferred pharmacy:  [facility] - [place], [place] - [address] [place]  [address] Phone: [phone] Fax: [phone]   Is this the correct pharmacy for this prescription? Yes If no, delete pharmacy and type the correct one.   Has the prescription been filled recently? Yes  Is the patient out of the medication? Yes  Has the patient been seen for an appointment in the last year OR does the patient have an upcoming appointment? Yes  Can we respond through MyChart? No, please call [phone]  Agent: Please be advised that Rx refills may take up to 3 business days. We ask that you follow-up with your pharmacy.

## 2024-05-11 MED ORDER — LISINOPRIL-HYDROCHLOROTHIAZIDE 20-25 MG PO TABS: 1.0000 | ORAL_TABLET | Freq: Every day | ORAL | 0 refills | Status: DC

## 2024-05-11 NOTE — Telephone Encounter (Signed)
 Requested medications are due for refill today.  yes  Requested medications are on the active medications list.  yes  Last refill. 01/21/2023 #90 3 rf  Future visit scheduled.   no  Notes to clinic.  Labs are expired. Pt is more than 3 months overdue for an ov.    Requested Prescriptions  Pending Prescriptions Disp Refills   lisinopril -hydrochlorothiazide  (ZESTORETIC ) 20-25 MG tablet 90 tablet 3    Sig: Take 1 tablet by mouth daily.     Cardiovascular:  ACEI + Diuretic Combos Failed - 05/11/2024  1:29 PM      Failed - Na in normal range and within 180 days    Sodium  Date Value Ref Range Status  11/11/2022 140 134 - 144 mmol/L Final         Failed - K in normal range and within 180 days    Potassium  Date Value Ref Range Status  11/11/2022 4.1 3.5 - 5.2 mmol/L Final         Failed - Cr in normal range and within 180 days    Creatinine, Ser  Date Value Ref Range Status  11/11/2022 0.79 0.57 - 1.00 mg/dL Final         Failed - eGFR is 30 or above and within 180 days    GFR calc Af Amer  Date Value Ref Range Status  06/09/2019 106 >59 mL/min/1.73 Final   GFR calc non Af Amer  Date Value Ref Range Status  06/09/2019 92 >59 mL/min/1.73 Final   GFR  Date Value Ref Range Status  04/14/2022 88.66 >60.00 mL/min Final    Comment:    Calculated using the CKD-EPI Creatinine Equation (2021)   eGFR  Date Value Ref Range Status  11/11/2022 80 >59 mL/min/1.73 Final         Failed - Valid encounter within last 6 months    Recent Outpatient Visits           10 months ago Type 2 diabetes mellitus with other specified complication, without long-term current use of insulin (HCC)   [facility] [place]. [facility] Vicci Sober B, MD   1 year ago Type 2 diabetes mellitus with hyperglycemia, without long-term current use of insulin [facility])   [facility] [place]. [facility] Vicci Sober B,  MD   1 year ago Type 2 diabetes mellitus with hyperglycemia, without long-term current use of insulin [facility])   [facility] Dept [facility] [place]. [facility] Vicci Sober B, MD   1 year ago Type 2 diabetes mellitus with hyperglycemia, without long-term current use of insulin [facility])   [facility] Dept Of [place]. [facility] Vicci Sober B, MD   2 years ago Type 2 diabetes mellitus with hyperglycemia, without long-term current use of insulin (HCC)   [facility] Dept Of [place]. [facility] Vicci Sober NOVAK, MD              Passed - Patient is not pregnant      Passed - Last BP in normal range    BP Readings from Last 1 Encounters:  07/12/23 113/64

## 2024-05-19 ENCOUNTER — Other Ambulatory Visit: Payer: Self-pay | Admitting: Internal Medicine

## 2024-05-19 DIAGNOSIS — I152 Hypertension secondary to endocrine disorders: Secondary | ICD-10-CM

## 2024-05-19 MED ORDER — LISINOPRIL-HYDROCHLOROTHIAZIDE 20-25 MG PO TABS: 1.0000 | ORAL_TABLET | Freq: Every day | ORAL | 0 refills | Status: AC

## 2024-05-19 NOTE — Telephone Encounter (Unsigned)
 Copied from CRM [id number]. Topic: Clinical - Prescription Issue >> May 18, 2024  5:09 PM Delon T wrote: Reason for CRM: lisinopril -hydrochlorothiazide  (ZESTORETIC ) 20-25 MG tablet - refill requestion not received- [facility] [phone] fax and phone [phone]

## 2024-05-19 NOTE — Telephone Encounter (Signed)
 RF sent. Needs to be seen prior to next refill request. Last seen 07/3023.

## 2024-05-19 NOTE — Telephone Encounter (Signed)
 Called but no answer. LVM informing that the requested refill was sent to the pharmacy. Informed to call back and schedule an OV. Patient expressed verbal understanding.

## 2024-05-23 NOTE — Telephone Encounter (Signed)
Called but no answer. Unable to LVM due to VM being full.

## 2024-06-06 ENCOUNTER — Telehealth: Payer: Self-pay | Admitting: Internal Medicine

## 2024-06-06 NOTE — Telephone Encounter (Signed)
-----   Message from Barnie Louder, MD sent at 06/05/2024  9:27 AM EST ----- Regarding: Appt Over due for in-office f/u visit for chronic disease management. Let pt know that she needs to be seen prior to any further refill request.

## 2024-06-06 NOTE — Telephone Encounter (Signed)
 Contacted patient, no answer. Left voicemail requesting patient to call back.

## 2024-06-07 ENCOUNTER — Telehealth: Payer: Self-pay | Admitting: Internal Medicine

## 2024-06-07 NOTE — Telephone Encounter (Signed)
 Called & spoke to [place], teacher, early years/pre at [facility]. Informed that the patient is only taking Lisinopril -hydrochlorothiazide  (ZESTORETIC ) 20-25 MG tablet. Informed to remove Lisinopril  10 mg off of their list. Rock expressed verbal understanding. No further assistance needed at this time.

## 2024-06-07 NOTE — Telephone Encounter (Signed)
 Copied from CRM #[id number]. Topic: Clinical - Prescription Issue >> Jun 06, 2024  5:13 PM.  Lennice HERO wrote:  Reason for CRM:  [facility] delivery pharmacy requesting prescription for Lisonipril 10 mg tablets. [phone] Fax number [phone] >> Jun 06, 2024  5:27 PM Nurse   Lauraine BROCKS, RN wrote: Medication unable to be pended and routed to [facility] >> Jun 06, 2024  5:18 PM   Darshell M wrote: The pharmacist was requesting a refill of the Lisinopril  10 mg is not in the patient's list of current medications. The patient has lisinopril -hydrochlorothiazide  (ZESTORETIC ) 20-25 MG tablet  but the pharmacist clarified this was not what he was requesting. >> Jun 06, 2024  5:16 PM   Darshell M wrote: The Lisinopril  10 mg tablets is not in the patient's list of medication. The patient

## 2024-06-07 NOTE — Telephone Encounter (Signed)
 Correct !

## 2024-06-07 NOTE — Telephone Encounter (Signed)
 Phone number used to contact [facility]: [phone].

## 2024-07-21 ENCOUNTER — Ambulatory Visit: Payer: Self-pay | Admitting: Internal Medicine
